# Patient Record
Sex: Male | Born: 1965 | Race: Black or African American | Hispanic: No | Marital: Single | Smoking: Never smoker
Health system: Southern US, Community
[De-identification: ages and names within clinical notes are randomized; demographics above are authoritative.]

## PROBLEM LIST (undated history)

## (undated) DIAGNOSIS — R011 Cardiac murmur, unspecified: Secondary | ICD-10-CM

## (undated) DIAGNOSIS — G4730 Sleep apnea, unspecified: Secondary | ICD-10-CM

## (undated) DIAGNOSIS — IMO0002 Reserved for concepts with insufficient information to code with codable children: Secondary | ICD-10-CM

## (undated) DIAGNOSIS — R0602 Shortness of breath: Secondary | ICD-10-CM

## (undated) DIAGNOSIS — R51 Headache: Secondary | ICD-10-CM

## (undated) DIAGNOSIS — M4804 Spinal stenosis, thoracic region: Secondary | ICD-10-CM

## (undated) DIAGNOSIS — E7800 Pure hypercholesterolemia, unspecified: Secondary | ICD-10-CM

## (undated) DIAGNOSIS — M4714 Other spondylosis with myelopathy, thoracic region: Secondary | ICD-10-CM

## (undated) HISTORY — DX: Pure hypercholesterolemia, unspecified: E78.00

## (undated) HISTORY — PX: ANKLE FRACTURE SURGERY: SHX122

## (undated) HISTORY — PX: BACK SURGERY: SHX140

## (undated) HISTORY — DX: Other spondylosis with myelopathy, thoracic region: M47.14

## (undated) HISTORY — DX: Spinal stenosis, thoracic region: M48.04

## (undated) HISTORY — DX: Reserved for concepts with insufficient information to code with codable children: IMO0002

---

## 2010-01-24 ENCOUNTER — Ambulatory Visit (HOSPITAL_COMMUNITY): Admission: RE | Admit: 2010-01-24 | Discharge: 2010-01-25 | Payer: Self-pay | Admitting: Neurosurgery

## 2010-02-21 ENCOUNTER — Inpatient Hospital Stay (HOSPITAL_COMMUNITY): Admission: RE | Admit: 2010-02-21 | Discharge: 2010-02-27 | Payer: Self-pay | Admitting: Neurosurgery

## 2010-02-21 HISTORY — PX: THORACIC DISCECTOMY: SHX100

## 2010-02-26 ENCOUNTER — Ambulatory Visit: Payer: Self-pay | Admitting: Physical Medicine & Rehabilitation

## 2010-02-27 ENCOUNTER — Ambulatory Visit: Payer: Self-pay | Admitting: Physical Medicine & Rehabilitation

## 2010-02-27 ENCOUNTER — Inpatient Hospital Stay (HOSPITAL_COMMUNITY)
Admission: RE | Admit: 2010-02-27 | Discharge: 2010-03-10 | Payer: Self-pay | Admitting: Physical Medicine & Rehabilitation

## 2010-04-14 ENCOUNTER — Encounter
Admission: RE | Admit: 2010-04-14 | Discharge: 2010-04-14 | Payer: Self-pay | Source: Home / Self Care | Attending: Physical Medicine & Rehabilitation | Admitting: Physical Medicine & Rehabilitation

## 2010-10-17 LAB — URINE CULTURE
Colony Count: >=100000
Culture  Setup Time: 201108012118

## 2010-10-17 LAB — URINALYSIS, ROUTINE W REFLEX MICROSCOPIC
Hgb urine dipstick: NEGATIVE
Protein, ur: NEGATIVE mg/dL
Urobilinogen, UA: 0.2 mg/dL (ref 0.0–1.0)

## 2010-10-17 LAB — BASIC METABOLIC PANEL
Chloride: 91 mEq/L — ABNORMAL LOW (ref 96–112)
Creatinine, Ser: 1.12 mg/dL (ref 0.4–1.5)
Glucose, Bld: 230 mg/dL — ABNORMAL HIGH (ref 70–99)

## 2010-10-18 LAB — DIFFERENTIAL
Basophils Relative: 0 % (ref 0–1)
Lymphocytes Relative: 10 % — ABNORMAL LOW (ref 12–46)
Monocytes Absolute: 0.7 10*3/uL (ref 0.1–1.0)

## 2010-10-18 LAB — BASIC METABOLIC PANEL
CO2: 29 mEq/L (ref 19–32)
GFR calc Af Amer: >60 mL/min (ref >60)
GFR calc non Af Amer: >60 mL/min (ref >60)
Potassium: 4.2 mEq/L (ref 3.5–5.1)

## 2010-10-18 LAB — CBC
HCT: 40.3 % (ref 39.0–52.0)
Hemoglobin: 13.3 g/dL (ref 13.0–17.0)
MCH: 31.1 pg (ref 26.0–34.0)
MCH: 31.4 pg (ref 26.0–34.0)
MCHC: 34.3 g/dL (ref 30.0–36.0)
MCV: 91.4 fL (ref 78.0–100.0)
MCV: 91.5 fL (ref 78.0–100.0)
Platelets: 213 10*3/uL (ref 150–400)
Platelets: 226 10*3/uL (ref 150–400)
RBC: 4.41 MIL/uL (ref 4.22–5.81)
WBC: 13.6 10*3/uL — ABNORMAL HIGH (ref 4.0–10.5)
WBC: 5.7 10*3/uL (ref 4.0–10.5)

## 2010-10-18 LAB — HEPATIC FUNCTION PANEL
Albumin: 4.3 g/dL (ref 3.5–5.2)
Alkaline Phosphatase: 60 U/L (ref 39–117)
Bilirubin, Direct: 0.2 mg/dL (ref 0.0–0.3)
Total Bilirubin: 0.5 mg/dL (ref 0.3–1.2)

## 2010-10-18 LAB — COMPREHENSIVE METABOLIC PANEL
ALT: 50 U/L (ref 0–53)
CO2: 30 mEq/L (ref 19–32)
Chloride: 92 mEq/L — ABNORMAL LOW (ref 96–112)
Creatinine, Ser: 1.06 mg/dL (ref 0.4–1.5)
GFR calc Af Amer: >60 mL/min (ref >60)
Potassium: 4.4 mEq/L (ref 3.5–5.1)
Sodium: 128 mEq/L — ABNORMAL LOW (ref 135–145)
Total Protein: 6.5 g/dL (ref 6.0–8.3)

## 2010-10-19 LAB — DIFFERENTIAL
Basophils Absolute: 0 10*3/uL (ref 0.0–0.1)
Eosinophils Absolute: 0.1 10*3/uL (ref 0.0–0.7)
Lymphocytes Relative: 22 % (ref 12–46)
Lymphs Abs: 1.5 10*3/uL (ref 0.7–4.0)
Monocytes Absolute: 0.8 10*3/uL (ref 0.1–1.0)
Neutrophils Relative %: 65 % (ref 43–77)

## 2010-10-19 LAB — CBC
HCT: 42.5 % (ref 39.0–52.0)
MCV: 92.2 fL (ref 78.0–100.0)
RBC: 4.61 MIL/uL (ref 4.22–5.81)
RDW: 13.4 % (ref 11.5–15.5)
WBC: 7 10*3/uL (ref 4.0–10.5)

## 2010-10-19 LAB — SURGICAL PCR SCREEN: MRSA, PCR: NEGATIVE

## 2011-11-18 ENCOUNTER — Ambulatory Visit (INDEPENDENT_AMBULATORY_CARE_PROVIDER_SITE_OTHER): Payer: Medicare Other | Admitting: Thoracic Surgery

## 2011-11-18 VITALS — BP 122/78 | HR 76 | Resp 20 | Ht 72.0 in | Wt 290.0 lb

## 2011-11-18 DIAGNOSIS — M5104 Intervertebral disc disorders with myelopathy, thoracic region: Secondary | ICD-10-CM

## 2011-11-18 DIAGNOSIS — M5124 Other intervertebral disc displacement, thoracic region: Secondary | ICD-10-CM

## 2011-11-18 NOTE — Progress Notes (Signed)
PCP is Elizebeth Koller, PA, PA Referring Provider is Carmela Hurt, MD  Chief Complaint  Patient presents with  . Herniated Nucleus Propulsus    Referral from Dr Franky Macho for eval on thoracic exsposure    HPI: This patient had a previous posterior approach for a T9 disc with a foraminotomy. According to the patient he was "paralyzed" after his surgery. However this in route and he did well for a while. Now he is having more weakness in his legs and numbness in both legs as well as his trouble with his bowels and sexual dysfunction. MRI in October and apparently an MRI done in [place] in the descending per showed her for a recurrent disc at T9-10. It is been recommended that he possibly have an anterior approach and repeat diskectomy by Dr. Mikal Plane. He is here for exploration of a possible anterior approach used to a right or left thoracotomy. I explained the risk of a thoracotomy. The patient understands the risk. I will discuss this with Dr. Mikal Plane. I will be happy to provide the exposure to Dr. Alvester Morin elect to do a repeat surgery. Past Medical History  Diagnosis Date  . Thoracic stenosis   . Myelopathy of thoracic region   . Herniated disc   . Diabetes mellitus   . High cholesterol     Past Surgical History  Procedure Date  . Thoracic discectomy 02/21/2010    Dr Franky Macho    No family history on file.  Social History History  Substance Use Topics  . Smoking status: Never Smoker   . Smokeless tobacco: Never Used  . Alcohol Use: Yes     5 drinks per week    Current Outpatient Prescriptions  Medication Sig Dispense Refill  . metFORMIN (GLUCOPHAGE) 500 MG tablet Take 500 mg by mouth 2 (two) times daily with a meal.      . oxyCODONE (OXYCONTIN) 15 MG TB12 Take 15 mg by mouth every 12 (twelve) hours.      Marland Kitchen oxyCODONE (OXYCONTIN) 40 MG 12 hr tablet Take 40 mg by mouth every 12 (twelve) hours.      . pravastatin (PRAVACHOL) 40 MG tablet Take 40 mg by mouth daily.        Allergies    Allergen Reactions  . Morphine And Related Nausea Only    Review of Systems  HENT: Negative.   Eyes: Negative.   Respiratory: Negative.   Genitourinary: Positive for difficulty urinating.  Musculoskeletal: Positive for back pain and gait problem.  Neurological: Positive for weakness and numbness.  Hematological: Negative.   Psychiatric/Behavioral: Negative.     BP 122/78  Pulse 76  Resp 20  Ht 6' (1.829 m)  Wt 290 lb (131.543 kg)  BMI 39.33 kg/m2  SpO2 97% Physical Exam  Constitutional: He is oriented to person, place, and time. He appears well-developed.  HENT:  Head: Normocephalic and atraumatic.  Right Ear: External ear normal.  Mouth/Throat: Oropharynx is clear and moist.  Eyes: Conjunctivae and EOM are normal. Pupils are equal, round, and reactive to light.  Neck: Normal range of motion. Neck supple.  Cardiovascular: Normal rate, regular rhythm, normal heart sounds and intact distal pulses.   Pulmonary/Chest: Effort normal and breath sounds normal.  Abdominal: Soft. Bowel sounds are normal.  Musculoskeletal: Normal range of motion.  Neurological: He is alert and oriented to person, place, and time. He has normal reflexes.  Skin: Skin is warm and dry.  Psychiatric: He has a normal mood and affect. His behavior  is normal. Judgment and thought content normal.   posterior midline approach  scar   Diagnostic Tests: MRI shows that T9 her disc herniation   Impression: T9 disc herniation status post discectomy Plan: Per Dr. Mikal Plane

## 2011-12-21 ENCOUNTER — Other Ambulatory Visit: Payer: Self-pay | Admitting: Neurosurgery

## 2012-01-05 ENCOUNTER — Other Ambulatory Visit: Payer: Self-pay

## 2012-01-05 ENCOUNTER — Inpatient Hospital Stay (HOSPITAL_COMMUNITY): Admission: RE | Admit: 2012-01-05 | Discharge: 2012-01-05 | Payer: Medicare Other | Source: Ambulatory Visit

## 2012-01-05 ENCOUNTER — Encounter (HOSPITAL_COMMUNITY): Payer: Self-pay | Admitting: Pharmacy Technician

## 2012-01-05 ENCOUNTER — Encounter (HOSPITAL_COMMUNITY): Payer: Self-pay

## 2012-01-05 DIAGNOSIS — M519 Unspecified thoracic, thoracolumbar and lumbosacral intervertebral disc disorder: Secondary | ICD-10-CM

## 2012-01-05 HISTORY — DX: Headache: R51

## 2012-01-05 HISTORY — DX: Sleep apnea, unspecified: G47.30

## 2012-01-05 HISTORY — DX: Cardiac murmur, unspecified: R01.1

## 2012-01-05 HISTORY — DX: Shortness of breath: R06.02

## 2012-01-05 NOTE — Pre-Procedure Instructions (Signed)
20 Allen Fleming  01/05/2012   Your procedure is scheduled on:  01/19/12  Report to [facility] at 530 AM.  Call this number if you have problems the morning of surgery: [phone]   Remember:   Do not eat food:After Midnight.  May have clear liquids: up to 4 Hours before arrival 130 AM.  Clear liquids include soda, tea, black coffee, apple or grape juice, broth.  Take these medicines the morning of surgery with A SIP OF WATER: PAIN MED   Do not wear jewelry, make-up or nail polish.  Do not wear lotions, powders, or perfumes. You may wear deodorant.  Do not shave 48 hours prior to surgery. Men may shave face and neck.  Do not bring valuables to the hospital.  Contacts, dentures or bridgework may not be worn into surgery.  Leave suitcase in the car. After surgery it may be brought to your room.  For patients admitted to the hospital, checkout time is 11:00 AM the day of discharge.   Patients discharged the day of surgery will not be allowed to drive home.  Name and phone number of your driver: Sherley Bounds AUNT [phone] CELL  Special Instructions: CHG Shower Use Special Wash: 1/2 bottle night before surgery and 1/2 bottle morning of surgery.   Please read over the following fact sheets that you were given: Pain Booklet, Coughing and Deep Breathing, Blood Transfusion Information, MRSA Information and Surgical Site Infection Prevention

## 2012-01-05 NOTE — Progress Notes (Signed)
DEE AT [facility] CALLED MESSAGE LEFT FOR ORDERS FOR DR Edwyna Shell OR WHOMEVER IS ASSISTING

## 2012-01-12 ENCOUNTER — Encounter (HOSPITAL_COMMUNITY)
Admission: RE | Admit: 2012-01-12 | Discharge: 2012-01-12 | Disposition: A | Discharge status: Home / Self Care | Payer: Medicare Other | Source: Ambulatory Visit | Attending: Neurosurgery | Admitting: Neurosurgery

## 2012-01-12 VITALS — BP 125/82 | HR 77 | Temp 97.9°F | Resp 20 | Ht 72.0 in | Wt 286.7 lb

## 2012-01-12 DIAGNOSIS — M519 Unspecified thoracic, thoracolumbar and lumbosacral intervertebral disc disorder: Secondary | ICD-10-CM

## 2012-01-12 LAB — BASIC METABOLIC PANEL
CO2: 30 mEq/L (ref 19–32)
Calcium: 9.7 mg/dL (ref 8.4–10.5)
Creatinine, Ser: 1.08 mg/dL (ref 0.50–1.35)
Glucose, Bld: 97 mg/dL (ref 70–99)

## 2012-01-12 LAB — CBC
MCH: 31.2 pg (ref 26.0–34.0)
MCHC: 35 g/dL (ref 30.0–36.0)
MCV: 89.1 fL (ref 78.0–100.0)
Platelets: 215 10*3/uL (ref 150–400)
RDW: 12.6 % (ref 11.5–15.5)

## 2012-01-12 LAB — ABO/RH: ABO/RH(D): O POS

## 2012-01-12 NOTE — Progress Notes (Signed)
Per Darl Pikes at Dr. Sueanne Margarita office, have consent signed as order states, "Right thoracotomy for T9/T10 herniated disc". Order was written that way by Dr. Franky Macho.

## 2012-01-14 MED ORDER — KETOROLAC TROMETHAMINE 30 MG/ML IJ SOLN
INTRAMUSCULAR | Status: AC
  Filled 2012-01-14: qty 1

## 2012-01-18 MED ORDER — CEFAZOLIN SODIUM-DEXTROSE 2-3 GM-% IV SOLR
2.0000 g | INTRAVENOUS | Status: AC
  Administered 2012-01-19 (×2): 2 g via INTRAVENOUS
  Filled 2012-01-18: qty 50

## 2012-01-19 ENCOUNTER — Ambulatory Visit (HOSPITAL_COMMUNITY): Payer: Medicare Other | Admitting: Certified Registered"

## 2012-01-19 ENCOUNTER — Encounter (HOSPITAL_COMMUNITY): Payer: Self-pay

## 2012-01-19 ENCOUNTER — Ambulatory Visit (HOSPITAL_COMMUNITY): Payer: Medicare Other

## 2012-01-19 ENCOUNTER — Encounter (HOSPITAL_COMMUNITY): Payer: Self-pay | Admitting: *Deleted

## 2012-01-19 ENCOUNTER — Encounter (HOSPITAL_COMMUNITY): Payer: Self-pay | Admitting: Certified Registered"

## 2012-01-19 ENCOUNTER — Inpatient Hospital Stay (HOSPITAL_COMMUNITY)
Admission: RE | Admit: 2012-01-19 | Discharge: 2012-01-24 | DRG: 490 | Disposition: A | Discharge status: Home / Self Care | Payer: Medicare Other | Source: Ambulatory Visit | Attending: Neurosurgery | Admitting: Neurosurgery

## 2012-01-19 ENCOUNTER — Encounter (HOSPITAL_COMMUNITY)
Admission: RE | Disposition: A | Discharge status: Home / Self Care | Payer: Self-pay | Source: Ambulatory Visit | Attending: Neurosurgery

## 2012-01-19 DIAGNOSIS — Z6841 Body Mass Index (BMI) 40.0 and over, adult: Secondary | ICD-10-CM

## 2012-01-19 DIAGNOSIS — Z79899 Other long term (current) drug therapy: Secondary | ICD-10-CM

## 2012-01-19 DIAGNOSIS — G4730 Sleep apnea, unspecified: Secondary | ICD-10-CM | POA: Diagnosis present

## 2012-01-19 DIAGNOSIS — E7800 Pure hypercholesterolemia, unspecified: Secondary | ICD-10-CM | POA: Diagnosis present

## 2012-01-19 DIAGNOSIS — M5104 Intervertebral disc disorders with myelopathy, thoracic region: Principal | ICD-10-CM | POA: Diagnosis present

## 2012-01-19 DIAGNOSIS — E119 Type 2 diabetes mellitus without complications: Secondary | ICD-10-CM | POA: Diagnosis present

## 2012-01-19 DIAGNOSIS — M546 Pain in thoracic spine: Secondary | ICD-10-CM

## 2012-01-19 DIAGNOSIS — Z01812 Encounter for preprocedural laboratory examination: Secondary | ICD-10-CM

## 2012-01-19 SURGERY — TRANSTHORACIC DISCECTOMY
Anesthesia: General | Site: Spine Thoracic | Laterality: Right | Wound class: Clean

## 2012-01-19 MED ORDER — ZOLPIDEM TARTRATE 5 MG PO TABS: 10.0000 mg | ORAL_TABLET | Freq: Every evening | ORAL | Status: DC | PRN

## 2012-01-19 MED ORDER — SENNOSIDES-DOCUSATE SODIUM 8.6-50 MG PO TABS
1.0000 | ORAL_TABLET | Freq: Every evening | ORAL | Status: DC | PRN
  Filled 2012-01-19 (×2): qty 1

## 2012-01-19 MED ORDER — ACETAMINOPHEN 650 MG RE SUPP: 650.0000 mg | RECTAL | Status: DC | PRN

## 2012-01-19 MED ORDER — DEXAMETHASONE SODIUM PHOSPHATE 4 MG/ML IJ SOLN
4.0000 mg | Freq: Four times a day (QID) | INTRAMUSCULAR | Status: AC
  Administered 2012-01-19 – 2012-01-20 (×3): 4 mg via INTRAVENOUS
  Filled 2012-01-19 (×5): qty 1

## 2012-01-19 MED ORDER — CLONIDINE HCL 0.1 MG PO TABS
0.1000 mg | ORAL_TABLET | Freq: Two times a day (BID) | ORAL | Status: DC | PRN
  Filled 2012-01-19: qty 1

## 2012-01-19 MED ORDER — MENTHOL 3 MG MT LOZG: 1.0000 | LOZENGE | OROMUCOSAL | Status: DC | PRN

## 2012-01-19 MED ORDER — 0.9 % SODIUM CHLORIDE (POUR BTL) OPTIME
TOPICAL | Status: DC | PRN
  Administered 2012-01-19: 1000 mL

## 2012-01-19 MED ORDER — VECURONIUM BROMIDE 10 MG IV SOLR
INTRAVENOUS | Status: DC | PRN
  Administered 2012-01-19: 1 mg via INTRAVENOUS
  Administered 2012-01-19 (×2): 2 mg via INTRAVENOUS
  Administered 2012-01-19: 3 mg via INTRAVENOUS

## 2012-01-19 MED ORDER — HYDROMORPHONE 0.3 MG/ML IV SOLN
INTRAVENOUS | Status: DC
  Administered 2012-01-19: 16:00:00 via INTRAVENOUS
  Administered 2012-01-19: 6 mg via INTRAVENOUS
  Administered 2012-01-20: 5.4 mg via INTRAVENOUS
  Administered 2012-01-20: 1.8 mg via INTRAVENOUS
  Administered 2012-01-20: 0.9 mg via INTRAVENOUS
  Administered 2012-01-20: 4.8 mg via INTRAVENOUS
  Administered 2012-01-20: 02:00:00 via INTRAVENOUS
  Administered 2012-01-21: 0.9 mg via INTRAVENOUS
  Administered 2012-01-21: 1.2 mg via INTRAVENOUS
  Administered 2012-01-21: via INTRAVENOUS
  Administered 2012-01-21: 1.2 mg via INTRAVENOUS
  Filled 2012-01-19 (×2): qty 25

## 2012-01-19 MED ORDER — OXYCODONE HCL 15 MG PO TB12: 15.0000 mg | ORAL_TABLET | Freq: Four times a day (QID) | ORAL | Status: DC

## 2012-01-19 MED ORDER — ACETAMINOPHEN 325 MG PO TABS
650.0000 mg | ORAL_TABLET | ORAL | Status: DC | PRN
  Administered 2012-01-23: 650 mg via ORAL
  Filled 2012-01-19 (×2): qty 2

## 2012-01-19 MED ORDER — ACETAMINOPHEN 10 MG/ML IV SOLN
INTRAVENOUS | Status: AC
  Filled 2012-01-19: qty 100

## 2012-01-19 MED ORDER — BUPIVACAINE-EPINEPHRINE PF 0.25-1:200000 % IJ SOLN
INTRAMUSCULAR | Status: AC
  Filled 2012-01-19: qty 30

## 2012-01-19 MED ORDER — HYDROCODONE-ACETAMINOPHEN 5-325 MG PO TABS
1.0000 | ORAL_TABLET | ORAL | Status: DC | PRN
  Administered 2012-01-23: 2 via ORAL
  Filled 2012-01-19: qty 2

## 2012-01-19 MED ORDER — LIDOCAINE-EPINEPHRINE (PF) 1 %-1:200000 IJ SOLN: INTRAMUSCULAR | Status: DC | PRN

## 2012-01-19 MED ORDER — POTASSIUM CHLORIDE IN NACL 20-0.9 MEQ/L-% IV SOLN
INTRAVENOUS | Status: DC
  Administered 2012-01-19: 18:00:00 via INTRAVENOUS
  Administered 2012-01-20: 100 mL/h via INTRAVENOUS
  Administered 2012-01-20: 23:00:00 via INTRAVENOUS
  Filled 2012-01-19 (×8): qty 1000

## 2012-01-19 MED ORDER — MIDAZOLAM HCL 5 MG/5ML IJ SOLN
INTRAMUSCULAR | Status: DC | PRN
  Administered 2012-01-19: 2 mg via INTRAVENOUS

## 2012-01-19 MED ORDER — SODIUM CHLORIDE 0.9 % IJ SOLN
3.0000 mL | Freq: Two times a day (BID) | INTRAMUSCULAR | Status: DC
  Administered 2012-01-19 – 2012-01-21 (×4): 3 mL via INTRAVENOUS

## 2012-01-19 MED ORDER — DEXTROSE 5 % IV SOLN
1.5000 g | Freq: Two times a day (BID) | INTRAVENOUS | Status: AC
  Administered 2012-01-19 – 2012-01-20 (×2): 1.5 g via INTRAVENOUS
  Filled 2012-01-19 (×2): qty 1.5

## 2012-01-19 MED ORDER — KETOROLAC TROMETHAMINE 30 MG/ML IJ SOLN
30.0000 mg | Freq: Four times a day (QID) | INTRAMUSCULAR | Status: DC
  Administered 2012-01-20 – 2012-01-22 (×8): 30 mg via INTRAVENOUS
  Filled 2012-01-19 (×17): qty 1

## 2012-01-19 MED ORDER — TRAMADOL HCL 50 MG PO TABS
50.0000 mg | ORAL_TABLET | Freq: Four times a day (QID) | ORAL | Status: DC | PRN
  Filled 2012-01-19: qty 2

## 2012-01-19 MED ORDER — FENTANYL CITRATE 0.05 MG/ML IJ SOLN
INTRAMUSCULAR | Status: DC | PRN
  Administered 2012-01-19 (×2): 100 ug via INTRAVENOUS
  Administered 2012-01-19 (×2): 50 ug via INTRAVENOUS
  Administered 2012-01-19: 25 ug via INTRAVENOUS
  Administered 2012-01-19: 100 ug via INTRAVENOUS
  Administered 2012-01-19: 25 ug via INTRAVENOUS
  Administered 2012-01-19 (×3): 50 ug via INTRAVENOUS

## 2012-01-19 MED ORDER — DIAZEPAM 5 MG PO TABS: 5.0000 mg | ORAL_TABLET | Freq: Four times a day (QID) | ORAL | Status: DC | PRN

## 2012-01-19 MED ORDER — HETASTARCH-ELECTROLYTES 6 % IV SOLN
INTRAVENOUS | Status: DC | PRN
  Administered 2012-01-19: 11:00:00 via INTRAVENOUS

## 2012-01-19 MED ORDER — ONDANSETRON HCL 4 MG/2ML IJ SOLN
INTRAMUSCULAR | Status: DC | PRN
  Administered 2012-01-19: 4 mg via INTRAVENOUS

## 2012-01-19 MED ORDER — DIPHENHYDRAMINE HCL 50 MG/ML IJ SOLN: 12.5000 mg | Freq: Four times a day (QID) | INTRAMUSCULAR | Status: DC | PRN

## 2012-01-19 MED ORDER — ACETAMINOPHEN 10 MG/ML IV SOLN
INTRAVENOUS | Status: DC | PRN
  Administered 2012-01-19: 1000 mg via INTRAVENOUS

## 2012-01-19 MED ORDER — POTASSIUM CHLORIDE 10 MEQ/50ML IV SOLN
10.0000 meq | Freq: Every day | INTRAVENOUS | Status: DC | PRN
  Filled 2012-01-19: qty 50

## 2012-01-19 MED ORDER — BISACODYL 5 MG PO TBEC
10.0000 mg | DELAYED_RELEASE_TABLET | Freq: Every day | ORAL | Status: DC
  Administered 2012-01-19 – 2012-01-24 (×6): 10 mg via ORAL
  Filled 2012-01-19: qty 2
  Filled 2012-01-19: qty 1
  Filled 2012-01-19 (×4): qty 2
  Filled 2012-01-19: qty 1
  Filled 2012-01-19: qty 2

## 2012-01-19 MED ORDER — LABETALOL HCL 5 MG/ML IV SOLN
5.0000 mg | INTRAVENOUS | Status: DC | PRN
  Administered 2012-01-19 (×2): 5 mg via INTRAVENOUS

## 2012-01-19 MED ORDER — ZOLPIDEM TARTRATE 10 MG PO TABS: 10.0000 mg | ORAL_TABLET | Freq: Every evening | ORAL | Status: DC | PRN

## 2012-01-19 MED ORDER — NALOXONE HCL 0.4 MG/ML IJ SOLN: 0.4000 mg | INTRAMUSCULAR | Status: DC | PRN

## 2012-01-19 MED ORDER — ONDANSETRON HCL 4 MG/2ML IJ SOLN: 4.0000 mg | Freq: Four times a day (QID) | INTRAMUSCULAR | Status: DC | PRN

## 2012-01-19 MED ORDER — OXYCODONE HCL 5 MG PO TABS
15.0000 mg | ORAL_TABLET | Freq: Four times a day (QID) | ORAL | Status: DC
  Administered 2012-01-20 – 2012-01-23 (×10): 15 mg via ORAL
  Filled 2012-01-19 (×7): qty 3
  Filled 2012-01-19: qty 2
  Filled 2012-01-19 (×5): qty 3
  Filled 2012-01-19: qty 1
  Filled 2012-01-19: qty 3

## 2012-01-19 MED ORDER — SODIUM CHLORIDE 0.9 % IV SOLN: 250.0000 mL | INTRAVENOUS | Status: DC

## 2012-01-19 MED ORDER — SIMVASTATIN 20 MG PO TABS
20.0000 mg | ORAL_TABLET | Freq: Every day | ORAL | Status: DC
  Administered 2012-01-20 – 2012-01-23 (×4): 20 mg via ORAL
  Filled 2012-01-19 (×6): qty 1

## 2012-01-19 MED ORDER — BUPIVACAINE ON-Q PAIN PUMP (FOR ORDER SET NO CHG)
INJECTION | Status: DC
  Filled 2012-01-19: qty 1

## 2012-01-19 MED ORDER — ROCURONIUM BROMIDE 100 MG/10ML IV SOLN
INTRAVENOUS | Status: DC | PRN
  Administered 2012-01-19: 20 mg via INTRAVENOUS
  Administered 2012-01-19: 50 mg via INTRAVENOUS
  Administered 2012-01-19: 30 mg via INTRAVENOUS

## 2012-01-19 MED ORDER — ONDANSETRON HCL 4 MG/2ML IJ SOLN: 4.0000 mg | INTRAMUSCULAR | Status: DC | PRN

## 2012-01-19 MED ORDER — DIPHENHYDRAMINE HCL 12.5 MG/5ML PO ELIX
12.5000 mg | ORAL_SOLUTION | Freq: Four times a day (QID) | ORAL | Status: DC | PRN
  Filled 2012-01-19: qty 5

## 2012-01-19 MED ORDER — THROMBIN 20000 UNITS EX KIT
PACK | CUTANEOUS | Status: DC | PRN
  Administered 2012-01-19: 20000 [IU] via TOPICAL

## 2012-01-19 MED ORDER — FENTANYL CITRATE 0.05 MG/ML IJ SOLN
INTRAMUSCULAR | Status: AC
  Filled 2012-01-19: qty 2

## 2012-01-19 MED ORDER — METFORMIN HCL 500 MG PO TABS
500.0000 mg | ORAL_TABLET | Freq: Two times a day (BID) | ORAL | Status: DC
  Administered 2012-01-20 – 2012-01-24 (×8): 500 mg via ORAL
  Filled 2012-01-19 (×13): qty 1

## 2012-01-19 MED ORDER — LACTATED RINGERS IV SOLN
INTRAVENOUS | Status: DC | PRN
  Administered 2012-01-19 (×3): via INTRAVENOUS

## 2012-01-19 MED ORDER — DEXAMETHASONE SODIUM PHOSPHATE 4 MG/ML IJ SOLN
INTRAMUSCULAR | Status: DC | PRN
  Administered 2012-01-19: 10 mg via INTRAVENOUS

## 2012-01-19 MED ORDER — LABETALOL HCL 5 MG/ML IV SOLN
INTRAVENOUS | Status: AC
  Filled 2012-01-19: qty 4

## 2012-01-19 MED ORDER — BUPIVACAINE-EPINEPHRINE 0.25% -1:200000 IJ SOLN
INTRAMUSCULAR | Status: DC | PRN
  Administered 2012-01-19: 30 mL

## 2012-01-19 MED ORDER — ALBUTEROL SULFATE HFA 108 (90 BASE) MCG/ACT IN AERS
2.0000 | INHALATION_SPRAY | Freq: Four times a day (QID) | RESPIRATORY_TRACT | Status: DC
  Filled 2012-01-19: qty 6.7

## 2012-01-19 MED ORDER — FENTANYL CITRATE 0.05 MG/ML IJ SOLN
25.0000 ug | INTRAMUSCULAR | Status: DC | PRN
  Administered 2012-01-19: 50 ug via INTRAVENOUS

## 2012-01-19 MED ORDER — SODIUM CHLORIDE 0.9 % IV SOLN
INTRAVENOUS | Status: AC
  Filled 2012-01-19: qty 500

## 2012-01-19 MED ORDER — HEMOSTATIC AGENTS (NO CHARGE) OPTIME
TOPICAL | Status: DC | PRN
  Administered 2012-01-19: 1 via TOPICAL

## 2012-01-19 MED ORDER — CEFAZOLIN SODIUM-DEXTROSE 2-3 GM-% IV SOLR
INTRAVENOUS | Status: AC
  Filled 2012-01-19: qty 50

## 2012-01-19 MED ORDER — ACETAMINOPHEN 10 MG/ML IV SOLN
1000.0000 mg | Freq: Four times a day (QID) | INTRAVENOUS | Status: AC
  Administered 2012-01-19 – 2012-01-20 (×4): 1000 mg via INTRAVENOUS
  Filled 2012-01-19 (×4): qty 100

## 2012-01-19 MED ORDER — SODIUM CHLORIDE 0.9 % IJ SOLN: 9.0000 mL | INTRAMUSCULAR | Status: DC | PRN

## 2012-01-19 MED ORDER — KETOROLAC TROMETHAMINE 30 MG/ML IJ SOLN
INTRAMUSCULAR | Status: AC
  Administered 2012-01-19: 30 mg
  Filled 2012-01-19: qty 1

## 2012-01-19 MED ORDER — PHENOL 1.4 % MT LIQD: 1.0000 | OROMUCOSAL | Status: DC | PRN

## 2012-01-19 MED ORDER — PROPOFOL 10 MG/ML IV EMUL
INTRAVENOUS | Status: DC | PRN
  Administered 2012-01-19: 10 mg via INTRAVENOUS
  Administered 2012-01-19: 100 mg via INTRAVENOUS
  Administered 2012-01-19: 50 mg via INTRAVENOUS
  Administered 2012-01-19: 400 mg via INTRAVENOUS

## 2012-01-19 MED ORDER — DEXAMETHASONE 4 MG PO TABS
4.0000 mg | ORAL_TABLET | Freq: Four times a day (QID) | ORAL | Status: AC
  Administered 2012-01-20: 4 mg via ORAL
  Filled 2012-01-19 (×3): qty 1

## 2012-01-19 MED ORDER — BUPIVACAINE 0.5 % ON-Q PUMP SINGLE CATH 400 ML
400.0000 mL | INJECTION | Status: DC
  Filled 2012-01-19: qty 400

## 2012-01-19 MED ORDER — HYDROMORPHONE 0.3 MG/ML IV SOLN
INTRAVENOUS | Status: AC
  Filled 2012-01-19: qty 25

## 2012-01-19 MED ORDER — OXYCODONE-ACETAMINOPHEN 5-325 MG PO TABS: 1.0000 | ORAL_TABLET | ORAL | Status: DC | PRN

## 2012-01-19 MED ORDER — BACITRACIN 50000 UNITS IM SOLR
INTRAMUSCULAR | Status: AC
  Filled 2012-01-19: qty 1

## 2012-01-19 MED ORDER — SODIUM CHLORIDE 0.9 % IJ SOLN: 3.0000 mL | INTRAMUSCULAR | Status: DC | PRN

## 2012-01-19 MED ORDER — OXYCODONE HCL 40 MG PO TB12
40.0000 mg | ORAL_TABLET | Freq: Two times a day (BID) | ORAL | Status: DC
  Administered 2012-01-20 – 2012-01-24 (×6): 40 mg via ORAL
  Filled 2012-01-19: qty 4
  Filled 2012-01-19 (×5): qty 1
  Filled 2012-01-19: qty 4

## 2012-01-19 SURGICAL SUPPLY — 96 items
BAG DECANTER FOR FLEXI CONT (MISCELLANEOUS) IMPLANT
BENZOIN TINCTURE PRP APPL 2/3 (GAUZE/BANDAGES/DRESSINGS) IMPLANT
BLADE SURG ROTATE 9660 (MISCELLANEOUS) ×4 IMPLANT
BRUSH SCRUB EZ PLAIN DRY (MISCELLANEOUS) IMPLANT
BUR CUTTER 7.0 ROUND (BURR) IMPLANT
BUR MATCHSTICK NEURO 3.0 LAGG (BURR) ×4 IMPLANT
BUR PRECISION FLUTE 5.0 (BURR) ×2 IMPLANT
BUR ROUND FLUTED 4 SOFT TCH (BURR) ×2 IMPLANT
CANISTER SUCTION 2500CC (MISCELLANEOUS) ×2 IMPLANT
CATH HYDRAGLIDE XL THORACIC (CATHETERS) IMPLANT
CATH THORACIC 28FR (CATHETERS) ×4 IMPLANT
CATH THORACIC 36FR (CATHETERS) IMPLANT
CATH THORACIC 36FR RT ANG (CATHETERS) IMPLANT
CLIP TI LARGE 6 (CLIP) ×4 IMPLANT
CLIP TI MEDIUM 6 (CLIP) IMPLANT
CLOTH BEACON ORANGE TIMEOUT ST (SAFETY) ×2 IMPLANT
CONNECTOR PH 6IN1 Y STRL (MISCELLANEOUS) IMPLANT
CONT SPEC 4OZ CLIKSEAL STRL BL (MISCELLANEOUS) ×2 IMPLANT
CORDS BIPOLAR (ELECTRODE) ×2 IMPLANT
COVER TABLE BACK 60X90 (DRAPES) ×4 IMPLANT
DECANTER SPIKE VIAL GLASS SM (MISCELLANEOUS) IMPLANT
DERMABOND ADHESIVE PROPEN (GAUZE/BANDAGES/DRESSINGS) ×1
DERMABOND ADVANCED (GAUZE/BANDAGES/DRESSINGS) ×1
DERMABOND ADVANCED .7 DNX12 (GAUZE/BANDAGES/DRESSINGS) ×1 IMPLANT
DERMABOND ADVANCED .7 DNX6 (GAUZE/BANDAGES/DRESSINGS) ×1 IMPLANT
DRAPE C-ARM 42X72 X-RAY (DRAPES) ×4 IMPLANT
DRAPE LAPAROTOMY 100X72X124 (DRAPES) IMPLANT
DRAPE MICROSCOPE LEICA (MISCELLANEOUS) ×2 IMPLANT
DRAPE MICROSCOPE ZEISS OPMI (DRAPES) IMPLANT
DRAPE POUCH INSTRU U-SHP 10X18 (DRAPES) ×2 IMPLANT
DRAPE PROXIMA HALF (DRAPES) ×10 IMPLANT
DRAPE SURG 17X23 STRL (DRAPES) IMPLANT
ELECT BLADE 6.5 EXT (BLADE) ×2 IMPLANT
ELECT REM PT RETURN 9FT ADLT (ELECTROSURGICAL) ×2
ELECTRODE REM PT RTRN 9FT ADLT (ELECTROSURGICAL) ×1 IMPLANT
GAUZE SPONGE 4X4 16PLY XRAY LF (GAUZE/BANDAGES/DRESSINGS) IMPLANT
GLOVE BIO SURGEON STRL SZ 6.5 (GLOVE) ×6 IMPLANT
GLOVE BIOGEL PI IND STRL 7.0 (GLOVE) ×3 IMPLANT
GLOVE BIOGEL PI IND STRL 7.5 (GLOVE) ×1 IMPLANT
GLOVE BIOGEL PI INDICATOR 7.0 (GLOVE) ×3
GLOVE BIOGEL PI INDICATOR 7.5 (GLOVE) ×1
GLOVE ECLIPSE 6.5 STRL STRAW (GLOVE) ×4 IMPLANT
GLOVE ECLIPSE 7.5 STRL STRAW (GLOVE) ×2 IMPLANT
GLOVE ECLIPSE 8.5 STRL (GLOVE) ×4 IMPLANT
GLOVE EXAM NITRILE LRG STRL (GLOVE) IMPLANT
GLOVE EXAM NITRILE MD LF STRL (GLOVE) ×4 IMPLANT
GLOVE EXAM NITRILE XL STR (GLOVE) IMPLANT
GLOVE EXAM NITRILE XS STR PU (GLOVE) IMPLANT
GLOVE SURG EUDERMIC 8 LTX PF (GLOVE) ×4 IMPLANT
GOWN BRE IMP SLV AUR LG STRL (GOWN DISPOSABLE) ×10 IMPLANT
GOWN BRE IMP SLV AUR XL STRL (GOWN DISPOSABLE) ×10 IMPLANT
GOWN STRL NON-REIN LRG LVL3 (GOWN DISPOSABLE) IMPLANT
GOWN STRL REIN 2XL LVL4 (GOWN DISPOSABLE) ×4 IMPLANT
HEMOSTAT SURGICEL 2X14 (HEMOSTASIS) IMPLANT
KIT BASIN OR (CUSTOM PROCEDURE TRAY) ×2 IMPLANT
KIT ROOM TURNOVER OR (KITS) ×2 IMPLANT
LOOP VESSEL MAXI BLUE (MISCELLANEOUS) ×2 IMPLANT
MARKER SKIN DUAL TIP RULER LAB (MISCELLANEOUS) IMPLANT
NEEDLE HYPO 22GX1.5 SAFETY (NEEDLE) ×2 IMPLANT
NEEDLE SPNL 22GX3.5 QUINCKE BK (NEEDLE) ×4 IMPLANT
NS IRRIG 1000ML POUR BTL (IV SOLUTION) ×2 IMPLANT
ONQ SILVER SOAKER ANTIMICROBIAL CATHETER KIT ×2 IMPLANT
PACK CHEST (CUSTOM PROCEDURE TRAY) ×2 IMPLANT
PAD ARMBOARD 7.5X6 YLW CONV (MISCELLANEOUS) ×12 IMPLANT
PATTIES SURGICAL .5 X.5 (GAUZE/BANDAGES/DRESSINGS) ×2 IMPLANT
PATTIES SURGICAL 1X1 (DISPOSABLE) ×2 IMPLANT
PENCIL BUTTON HOLSTER BLD 10FT (ELECTRODE) ×2 IMPLANT
RUBBERBAND STERILE (MISCELLANEOUS) ×4 IMPLANT
SPONGE GAUZE 4X4 12PLY (GAUZE/BANDAGES/DRESSINGS) ×2 IMPLANT
SPONGE SURGIFOAM ABS GEL 100 (HEMOSTASIS) ×2 IMPLANT
SPONGE SURGIFOAM ABS GEL SZ50 (HEMOSTASIS) IMPLANT
STAPLER VISISTAT 35W (STAPLE) ×2 IMPLANT
STRIP CLOSURE SKIN 1/2X4 (GAUZE/BANDAGES/DRESSINGS) ×2 IMPLANT
SUT BONE WAX W31G (SUTURE) IMPLANT
SUT SILK 0 FSL (SUTURE) ×6 IMPLANT
SUT SILK 2 0SH CR/8 30 (SUTURE) IMPLANT
SUT VIC AB 0 CT1 27 (SUTURE) ×1
SUT VIC AB 0 CT1 27XBRD ANTBC (SUTURE) ×1 IMPLANT
SUT VIC AB 1 CTX 18 (SUTURE) ×4 IMPLANT
SUT VIC AB 1 CTX 36 (SUTURE) ×1
SUT VIC AB 1 CTX36XBRD ANBCTR (SUTURE) ×1 IMPLANT
SUT VIC AB 2-0 CT1 18 (SUTURE) IMPLANT
SUT VIC AB 2-0 CTX 36 (SUTURE) ×4 IMPLANT
SUT VIC AB 3-0 MH 27 (SUTURE) ×4 IMPLANT
SUT VIC AB 3-0 SH 8-18 (SUTURE) IMPLANT
SUT VIC AB 3-0 X1 27 (SUTURE) ×2 IMPLANT
SUT VICRYL 2 TP 1 (SUTURE) ×2 IMPLANT
SYR 20ML ECCENTRIC (SYRINGE) ×2 IMPLANT
TAPE CLOTH 4X10 WHT NS (GAUZE/BANDAGES/DRESSINGS) ×2 IMPLANT
TAPE CLOTH SURG 4X10 WHT LF (GAUZE/BANDAGES/DRESSINGS) ×2 IMPLANT
TOWEL OR 17X24 6PK STRL BLUE (TOWEL DISPOSABLE) ×4 IMPLANT
TOWEL OR 17X26 10 PK STRL BLUE (TOWEL DISPOSABLE) ×4 IMPLANT
TRAY FOLEY CATH 14FRSI W/METER (CATHETERS) ×2 IMPLANT
TUBE CONNECTING 12X1/4 (SUCTIONS) ×2 IMPLANT
WATER STERILE IRR 1000ML POUR (IV SOLUTION) ×2 IMPLANT
YANKAUER SUCT BULB TIP NO VENT (SUCTIONS) ×2 IMPLANT

## 2012-01-19 NOTE — Transfer of Care (Signed)
Immediate Anesthesia Transfer of Care Note  Patient: Allen Fleming  Procedure(s) Performed: Procedure(s) (LRB): TRANSTHORACIC DISCECTOMY (Right) THORACIC EXPOSURE (Right)  Patient Location: PACU  Anesthesia Type: General  Level of Consciousness: awake, oriented, unresponsive and responds to stimulation  Airway & Oxygen Therapy: Patient Spontanous Breathing and Patient connected to nasal cannula oxygen  Post-op Assessment: Report given to PACU RN and Post -op Vital signs reviewed and stable  Post vital signs: Reviewed and stable  Complications: No apparent anesthesia complications

## 2012-01-19 NOTE — Op Note (Signed)
01/19/2012  3:32 PM  PATIENT:  Malva Limes  46 y.o. male  PRE-OPERATIVE DIAGNOSIS:  Thoracic herniated disc T9/10  POST-OPERATIVE DIAGNOSIS:  Thoracic herniated disc T9/10  PROCEDURE:  Procedure(s): TRANSTHORACIC DISCECTOMY T9/10 THORACIC EXPOSURE  SURGEON:  Surgeon(s): Carmela Hurt, MD Ines Bloomer, MD Temple Pacini, MD  ASSISTANTS:pool  ANESTHESIA:   general  EBL:  Total I/O In: 3500 [I.V.:3000; IV Piggyback:500] Out: 1200 [Urine:700; Blood:500]  BLOOD ADMINISTERED:none  CELL SAVER GIVEN:none  COUNT:per nursing  DRAINS: chest tube   SPECIMEN:  No Specimen  DICTATION: Mr. Bolin was taken to the operating room intubated and placed under a general anesthetic without difficulty. A foley catheter was placed without difficulty. At this point Dr. Karle Plumber note dictated under separate cover deals with his thoracotomy.  After gaining exposure, we identified the correct level in both the AP plane and sagittal planes with fluoroscopy and plain films.  I used the monpolar cautery to remove some of the soft tissue overlying the disc and rib head. I opened the disc space with a 15 blade,and remove a small amount of disc with pituitary rongeurs. I then used the drill and microscope and drill into the disc space, T9, and T10 corpii to gain exposure to the spinal canal. I encountered very thick and calcified material just around the spinal cord. With Dr. Lindalou Hose assistance we were able to get around the cord in the canal. We removed disc and bone but were able to decompress the anterior and right anterior portion of the canal. I used a drill and various dissectors to achieve this. With the decompression being complete and further dissection certainly seeming more dangerous than beneficial we stopped.  Dr. Edwyna Shell returned and completed the case.  PLAN OF CARE: Admit to inpatient   PATIENT DISPOSITION:  PACU - hemodynamically stable.   Delay start of Pharmacological VTE agent  (>24hrs) due to surgical blood loss or risk of bleeding:  yes

## 2012-01-19 NOTE — H&P (Addendum)
BP 120/75  Pulse 72  Temp 98.4 F (36.9 C) (Oral)  Resp 18 Allen Fleming returns.  EMG and nerve conduction studies were normal.  He and I had about a half hour discussion with regards to this operation.  I told him that I think it is very risky to go back and remove what is a small amount of residual material; however, he says that without question he is getting worse and since he is getting worse he would like to proceed with the operation.  At the last operation, I did a posterior approach because he had bilateral compression.  Now the compression is truly just from the right side.  I plan on a thoracotomy.  He has already been seen and evaluated by Dr. Karle Plumber for this procedure.  We discussed the nature of a thoracotomy that it is a very painful incision, that I would probably fuse his spine afterwards by using a cage of some sort and a plate as a buttress to the cage so that it could not kick out.  He understands the significant risk to his lower extremity function, bowel, bladder function, sexual function, possible paralysis, further weakness, inability to remove the tumor, spinal cord damage, damage to the lungs, possible spinal cord infarct and other risks.  With that understanding, he would like to proceed and we will go ahead and schedule this with Dr. Edwyna Shell.   Past Medical History  Diagnosis Date  . Thoracic stenosis   . Myelopathy of thoracic region   . Herniated disc   . Diabetes mellitus   . High cholesterol   . Heart murmur   . Shortness of breath   . Sleep apnea     CPAP SINCE 98  . Headache    Past Surgical History  Procedure Date  . Thoracic discectomy 02/21/2010    Dr Franky Macho  . Back surgery   . Ankle fracture surgery     RT   Allergies  Allergen Reactions  . Morphine And Related Nausea Only   Prior to Admission medications   Medication Sig Start Date End Date Taking? Authorizing Provider  metFORMIN (GLUCOPHAGE) 500 MG tablet Take 500 mg by mouth 2 (two)  times daily with a meal.   Yes Historical Provider, MD  oxyCODONE (OXYCONTIN) 15 MG TB12 Take 15 mg by mouth every 6 (six) hours.    Yes Historical Provider, MD  oxyCODONE (OXYCONTIN) 40 MG 12 hr tablet Take 40 mg by mouth every 12 (twelve) hours.   Yes Historical Provider, MD  pravastatin (PRAVACHOL) 40 MG tablet Take 40 mg by mouth daily.   Yes Historical Provider, MD   History   Social History  . Marital Status: Single    Spouse Name: N/A    Number of Children: 4  . Years of Education: N/A   Occupational History  .     Social History Main Topics  . Smoking status: Never Smoker   . Smokeless tobacco: Never Used  . Alcohol Use: 2.4 oz/week    4 Cans of beer per week     5 drinks per week  . Drug Use: No  . Sexually Active: Yes    Birth Control/ Protection: Other-see comments   Other Topics Concern  . Not on file   Social History Narrative  . No narrative on file  alert, oriented x4 Speech clear and fluent 5/5 upper extremities, weakness bilaterally lower extremities Spastic gait,  Hyper reflexic lower extremities Decreased sensation bilateral lower extremities  Proceed to or for T9/10 discetomy.

## 2012-01-19 NOTE — Brief Op Note (Signed)
01/19/2012  2:58 PM  PATIENT:  Malva Limes  46 y.o. male  PRE-OPERATIVE DIAGNOSIS:  Thoracic herniated disc  POST-OPERATIVE DIAGNOSIS:  Thoracic herniated disc  PROCEDURE:  Procedure(s) (LRB): TRANSTHORACIC DISCECTOMY (Right) THORACIC EXPOSURE (Right)  SURGEON:  Surgeon(s) and Role: Panel 1:    * Carmela Hurt, MD - Primary    * Temple Pacini, MD - Assisting  Panel 2:    * Ines Bloomer, MD - Primary  PHYSICIAN ASSISTANT:   ASSISTANTS:Donnielle Joycelyn Man   ANESTHESIA:   general  EBL:  Total I/O In: 3500 [I.V.:3000; IV Piggyback:500] Out: 1100 [Urine:600; Blood:500]  BLOOD ADMINISTERED:none  DRAINS: right Chest Tube(s) in the pleura   LOCAL MEDICATIONS USED:  MARCAINE     SPECIMEN:  Biopsy / Limited Resection  DISPOSITION OF SPECIMEN:  PATHOLOGY  COUNTS:  YES  TOURNIQUET:  * No tourniquets in log *  DICTATION: .Other Dictation: Dictation Number [id number]  PLAN OF CARE: Admit to inpatient   PATIENT DISPOSITION:  PACU - hemodynamically stable.   Delay start of Pharmacological VTE agent (>24hrs) due to surgical blood loss or risk of bleeding: yes

## 2012-01-19 NOTE — H&P (Signed)
Allen Fleming   11/18/2011 2:15 PM Office Visit  MRN: [id number]   Description: 46 year old male  Provider: Cameron Proud, MD  Department: [facility]        Diagnoses     HNP (herniated nucleus pulposus), thoracic   - Primary    722.11    Intervertebral thoracic disc disorder with myelopathy, thoracic region     722.72      Reason for Visit     Herniated Nucleus Propulsus    Referral from Dr Franky Macho for eval on thoracic exsposure        Vitals - Last Recorded       BP Pulse Resp Ht Wt BMI    122/78 76 20 6' (1.829 m) 131.543 kg (290 lb) 39.33 kg/m2         SpO2              97%                 Vitals History Recorded       Progress Notes     Cameron Proud, MD  11/18/2011  3:10 PM  Signed PCP is Elizebeth Koller, PA, PA Referring Provider is Carmela Hurt, MD    Chief Complaint   Patient presents with   .  Herniated Nucleus Propulsus       Referral from Dr Franky Macho for eval on thoracic exsposure      HPI: This patient had a previous posterior approach for a T9 disc with a foraminotomy. According to the patient he was "paralyzed" after his surgery. However this in route and he did well for a while. Now he is having more weakness in his legs and numbness in both legs as well as his trouble with his bowels and sexual dysfunction. MRI in October and apparently an MRI done in [place] in the descending per showed her for a recurrent disc at T9-10. It is been recommended that he possibly have an anterior approach and repeat diskectomy by Dr. Mikal Plane. He is here for exploration of a possible anterior approach used to a right or left thoracotomy. I explained the risk of a thoracotomy. The patient understands the risk. I will discuss this with Dr. Mikal Plane. I will be happy to provide the exposure to Dr. Alvester Morin elect to do a repeat surgery. Past Medical History   Diagnosis  Date   .  Thoracic stenosis     .  Myelopathy of thoracic region     .   Herniated disc     .  Diabetes mellitus     .  High cholesterol         Past Surgical History   Procedure  Date   .  Thoracic discectomy  02/21/2010       Dr Franky Macho      No family history on file.   Social History History   Substance Use Topics   .  Smoking status:  Never Smoker    .  Smokeless tobacco:  Never Used   .  Alcohol Use:  Yes         5 drinks per week       Current Outpatient Prescriptions   Medication  Sig  Dispense  Refill   .  metFORMIN (GLUCOPHAGE) 500 MG tablet  Take 500 mg by mouth 2 (two) times daily with a meal.         .  oxyCODONE (OXYCONTIN) 15 MG TB12  Take 15 mg by mouth every 12 (twelve) hours.         Marland Kitchen  oxyCODONE (OXYCONTIN) 40 MG 12 hr tablet  Take 40 mg by mouth every 12 (twelve) hours.         .  pravastatin (PRAVACHOL) 40 MG tablet  Take 40 mg by mouth daily.             Allergies   Allergen  Reactions   .  Morphine And Related  Nausea Only      Review of Systems  HENT: Negative.   Eyes: Negative.   Respiratory: Negative.   Genitourinary: Positive for difficulty urinating.  Musculoskeletal: Positive for back pain and gait problem.  Neurological: Positive for weakness and numbness.  Hematological: Negative.   Psychiatric/Behavioral: Negative.     BP 122/78  Pulse 76  Resp 20  Ht 6' (1.829 m)  Wt 290 lb (131.543 kg)  BMI 39.33 kg/m2  SpO2 97% Physical Exam  Constitutional: He is oriented to person, place, and time. He appears well-developed.  HENT:   Head: Normocephalic and atraumatic.  Right Ear: External ear normal.   Mouth/Throat: Oropharynx is clear and moist.  Eyes: Conjunctivae and EOM are normal. Pupils are equal, round, and reactive to light.  Neck: Normal range of motion. Neck supple.  Cardiovascular: Normal rate, regular rhythm, normal heart sounds and intact distal pulses.   Pulmonary/Chest: Effort normal and breath sounds normal.  Abdominal: Soft. Bowel sounds are normal.  Musculoskeletal: Normal range of  motion.  Neurological: He is alert and oriented to person, place, and time. He has normal reflexes.  Skin: Skin is warm and dry.  Psychiatric: He has a normal mood and affect. His behavior is normal. Judgment and thought content normal.  posterior midline approach  scar     Diagnostic Tests: MRI shows that T9 her disc herniation     Impression: T9 disc herniation status post discectomy Plan: Per Dr. Mikal Plane        Electronic signature on 11/18/2011        Not recorded              Level of Service     PR OFFICE OUTPATIENT NEW 30 MINUTES [[id number]]         All Flowsheet Templates (all recorded)     Encounter Vitals Flowsheet    Custom Formula Data Flowsheet    Anthropometrics Flowsheet               Referring Provider          Carmela Hurt, MD       All Charges for This Encounter       Code Description Service Date Service Provider Modifiers Quantity    [phone] PR OFFICE OUTPATIENT NEW 30 MINUTES 11/18/2011 Ines Bloomer, MD   1    [phone] PR CURRENT TOBACCO NON-USER 11/18/2011 Ines Bloomer, MD   1        Other Encounter Related Information     Allergies & Medications         Problem List         History         Patient-Entered Questionnaires   Printed AVS Reports     No AVS reports have been printed for this encounter.        No data filed

## 2012-01-19 NOTE — Preoperative (Signed)
Beta Blockers   Reason not to administer Beta Blockers:Not Applicable 

## 2012-01-19 NOTE — Anesthesia Postprocedure Evaluation (Signed)
  Anesthesia Post-op Note  Patient: Allen Fleming  Procedure(s) Performed: Procedure(s) (LRB): TRANSTHORACIC DISCECTOMY (Right) THORACIC EXPOSURE (Right)  Patient Location: PACU  Anesthesia Type: General  Level of Consciousness: awake and alert   Airway and Oxygen Therapy: Patient Spontanous Breathing and Patient connected to face mask oxygen  Post-op Pain: mild  Post-op Assessment: Post-op Vital signs reviewed  Post-op Vital Signs: Reviewed  Complications: No apparent anesthesia complications

## 2012-01-19 NOTE — Progress Notes (Signed)
Pt's home CPAP machine set up and water added to humidifier. Pt stated he could place himself on later. RN aware.

## 2012-01-19 NOTE — Interval H&P Note (Signed)
History and Physical Interval Note:  01/19/2012 7:07 AM  Allen Fleming  has presented today for surgery, with the diagnosis of Thoracic herniated disc  The various methods of treatment have been discussed with the patient and family. After consideration of risks, benefits and other options for treatment, the patient has consented to  Procedure(s) (LRB): TRANSTHORACIC DISCECTOMY (Right) THORACIC EXPOSURE (N/A) as a surgical intervention .  The patient's history has been reviewed, patient examined, no change in status, stable for surgery.  I have reviewed the patients' chart and labs.  Questions were answered to the patient's satisfaction.     Cameron Proud

## 2012-01-19 NOTE — Progress Notes (Signed)
BP 125/59  Pulse 93  Temp 97 F (36.1 C) (Oral)  Resp 16  SpO2 97% Alert, following commands Moving lower extremities

## 2012-01-19 NOTE — Anesthesia Preprocedure Evaluation (Signed)
Anesthesia Evaluation  Patient identified by MRN, date of birth, ID band Patient awake    Reviewed: Allergy & Precautions, H&P , NPO status   Airway Mallampati: II      Dental   Pulmonary shortness of breath, sleep apnea ,          Cardiovascular negative cardio ROS      Neuro/Psych  Headaches,    GI/Hepatic negative GI ROS, Neg liver ROS,   Endo/Other  Diabetes mellitus-  Renal/GU negative Renal ROS     Musculoskeletal   Abdominal   Peds  Hematology negative hematology ROS (+)   Anesthesia Other Findings   Reproductive/Obstetrics                           Anesthesia Physical Anesthesia Plan  ASA: III  Anesthesia Plan: General   Post-op Pain Management:    Induction: Intravenous  Airway Management Planned: Double Lumen EBT  Additional Equipment: Arterial line  Intra-op Plan:   Post-operative Plan: Possible Post-op intubation/ventilation  Informed Consent:   Plan Discussed with: CRNA  Anesthesia Plan Comments:         Anesthesia Quick Evaluation

## 2012-01-19 NOTE — Anesthesia Procedure Notes (Addendum)
Procedure Name: Intubation Date/Time: 01/19/2012 8:56 AM Performed by: Rossie Muskrat L Pre-anesthesia Checklist: Patient identified, Timeout performed, Emergency Drugs available, Suction available and Patient being monitored Patient Re-evaluated:Patient Re-evaluated prior to inductionOxygen Delivery Method: Circle system utilized Preoxygenation: Pre-oxygenation with 100% oxygen Intubation Type: IV induction Ventilation: Mask ventilation without difficulty and Oral airway inserted - appropriate to patient size Laryngoscope Size: Mac and 4 Grade View: Grade I Tube type: Oral Endobronchial tube: EBT position confirmed by fiberoptic bronchoscope, Left, Double lumen EBT and EBT position confirmed by auscultation and 39 Fr Number of attempts: 2 (grade 1 view with first DL, however unable to pass DL ETT thru cords. Head repositioned, pillows removed, ETT thru cords without diff on second DL) Airway Equipment and Method: Stylet Placement Confirmation: ETT inserted through vocal cords under direct vision,  breath sounds checked- equal and bilateral and positive ETCO2 Tube secured with: Tape Dental Injury: Teeth and Oropharynx as per pre-operative assessment     RIJ CVP [id number]: The patient was identified and consent obtained.  TO was performed, and full barrier precautions were used.  The skin was anesthetized with lidocaine.  Once the vein was located with the 22 ga. needle using ultrasound guidance , the wire was inserted into the vein.  The wire location was confirmed with ultrasound.  The tissue was dilated and the catheter was carefully inserted, then sutured in place. A dressing was applied. The patient tolerated the procedure well.   CE

## 2012-01-20 ENCOUNTER — Inpatient Hospital Stay (HOSPITAL_COMMUNITY): Payer: Medicare Other

## 2012-01-20 DIAGNOSIS — M5104 Intervertebral disc disorders with myelopathy, thoracic region: Secondary | ICD-10-CM | POA: Diagnosis present

## 2012-01-20 LAB — BASIC METABOLIC PANEL
Calcium: 8.8 mg/dL (ref 8.4–10.5)
Creatinine, Ser: 0.96 mg/dL (ref 0.50–1.35)
GFR calc Af Amer: >90 mL/min (ref >90)
GFR calc non Af Amer: >90 mL/min (ref >90)
Sodium: 137 mEq/L (ref 135–145)

## 2012-01-20 LAB — CBC
Hemoglobin: 13.1 g/dL (ref 13.0–17.0)
MCH: 31.4 pg (ref 26.0–34.0)
MCV: 90.2 fL (ref 78.0–100.0)
Platelets: 198 10*3/uL (ref 150–400)
RBC: 4.17 MIL/uL — ABNORMAL LOW (ref 4.22–5.81)
RDW: 13.2 % (ref 11.5–15.5)

## 2012-01-20 LAB — BLOOD GAS, ARTERIAL
Bicarbonate: 27.5 mEq/L — ABNORMAL HIGH (ref 20.0–24.0)
Drawn by: 31288
O2 Content: 2 L/min
O2 Saturation: 98 %
Patient temperature: 98.6
pH, Arterial: 7.365 (ref 7.350–7.450)

## 2012-01-20 MED ORDER — WHITE PETROLATUM GEL
Status: AC
  Administered 2012-01-20: 1
  Filled 2012-01-20: qty 5

## 2012-01-20 MED ORDER — ALBUTEROL SULFATE HFA 108 (90 BASE) MCG/ACT IN AERS: 2.0000 | INHALATION_SPRAY | Freq: Four times a day (QID) | RESPIRATORY_TRACT | Status: DC | PRN

## 2012-01-20 NOTE — Progress Notes (Signed)
Nursing 1600 Dr. Edwyna Shell made aware of chest tube dressing change and that On Q pain pump was empty and discontinued.  No new orders.

## 2012-01-20 NOTE — Op Note (Signed)
Allen Fleming, DRUDGE NO.:  [id number]  MEDICAL RECORD NO.:  [id number]  LOCATION:  3104                         FACILITY:  [facility]  PHYSICIAN:  Ines Bloomer, M.D. DATE OF BIRTH:  10-Oct-1965  DATE OF PROCEDURE: DATE OF DISCHARGE:                              OPERATIVE REPORT   PREOPERATIVE DIAGNOSIS:  T9-10 herniated disk.  POSTOPERATIVE DIAGNOSIS:  T9-10 herniated disk.  OPERATION PERFORMED:  Right thoracotomy exposure up to T9-10 disk.  SURGEON:  Ines Bloomer, M.D.  FIRST ASSISTANT:  Doree Fudge, PA  ANESTHESIA:  General anesthesia with built in intubation.  After general anesthesia, the patient was intubated with a dual-lumen tube and the right lung was deflated.  He was turned to the right lateral thoracotomy position.  Dr. Franky Macho was going to do a T9-10 diskectomy, and we were asked to do the exposure.  A posterolateral thoracotomy incision was made over the 9th intercostal space.  The latissimus was divided and the serratus was reflected anteriorly.  The 9th intercostal space was entered with electrocautery and TVA placed in the pleuritic space.  We then divided the pleura posteriorly and dissected out the rib heads and the vertebral bodies of 9-10, 10-11, and 11-12.  We put a needle in to the 11-12 disk space and took pictures and then put another needle into the 9-10 disk space and took radiological pictures to confirm where the 9-10 disk space was, that was done by Dr. Franky Macho, then did diskectomy and we came back in to close.  Single On-Q was inserted in the usual fashion posteriorly.  Single chest tube was brought through a separate stab wound and tied in place with 0 silk.  Marcaine block was done in the usual fashion.  We drilled 4 holes on the 10th rib and passed the pericostal through the 10th rib and superiorly around the 9th rib.  The lung was re-expanded.  It had been collapsed during the procedure.  All retractors were  removed.  The chest was closed with those 4 pericostal #1 Vicryl in the muscle layer, 2-0 Vicryl in subcutaneous tissue, and Ethicon skin clips.  The patient had returned to recovery room in stable condition.     Ines Bloomer, M.D.     DPB/MEDQ  D:  01/19/2012  T:  01/19/2012  Job:  [id number]

## 2012-01-20 NOTE — Progress Notes (Signed)
Patient ID: Allen Fleming, male   DOB: Sep 11, 1965, 46 y.o.   MRN: [id number] BP 131/69  Pulse 89  Temp 98 F (36.7 C) (Oral)  Resp 14  Ht 6' (1.829 m)  Wt 133.8 kg (294 lb 15.6 oz)  BMI 40.01 kg/m2  SpO2 100% Alert and oriented x 4. Speech clear and fluent Moving lower extremities well. Dressing intact. Doing well neurologically.  May be out of bed.

## 2012-01-20 NOTE — Progress Notes (Addendum)
1 Day Post-Op Procedure(s) (LRB): TRANSTHORACIC DISCECTOMY (Right) THORACIC EXPOSURE (Right)  Subjective: Patient's incisional pain is fairly well controlled with PCA.  Objective: Vital signs in last 24 hours: Patient Vitals for the past 24 hrs:  BP Temp Temp src Pulse Resp SpO2 Height Weight  01/20/12 0600 131/78 mmHg - - 87  17  100 % - -  01/20/12 0500 117/71 mmHg - - 82  10  97 % - -  01/20/12 0400 129/71 mmHg 97.8 F (36.6 C) Oral 83  14  99 % - -  01/20/12 0300 128/67 mmHg - - 91  17  99 % - -  01/20/12 0227 - - - - 16  99 % - -  01/20/12 0200 130/61 mmHg - - 83  12  99 % - -  01/20/12 0100 139/63 mmHg - - 87  16  95 % - -  01/20/12 0000 151/64 mmHg 98.9 F (37.2 C) Axillary 92  14  100 % - -  01/19/12 2300 159/75 mmHg - - 89  17  96 % - -  01/19/12 2200 142/74 mmHg - - 91  17  99 % - -  01/19/12 2100 141/77 mmHg - - 84  15  100 % - -  01/19/12 2000 137/103 mmHg 99.1 F (37.3 C) Axillary 91  16  98 % - -  01/19/12 1900 118/61 mmHg - - 88  17  96 % - -  01/19/12 1800 125/59 mmHg - - 93  16  97 % 6' (1.829 m) 294 lb 15.6 oz (133.8 kg)  01/19/12 1700 - - - 88  16  100 % - -  01/19/12 1659 - 97 F (36.1 C) - - - - - -  01/19/12 1554 - 97.5 F (36.4 C) - - - - - -    Current Weight  01/19/12 294 lb 15.6 oz (133.8 kg)      Intake/Output from previous day: 06/18 0701 - 06/19 0700 In: 6465 [P.O.:1340; I.V.:4475; IV Piggyback:650] Out: 6600 [Urine:5750; Blood:500; Chest Tube:350]   Physical Exam:  Cardiovascular: RRR. Pulmonary: Clear to auscultation bilaterally; no rales, wheezes, or rhonchi. Abdomen: Soft, non tender, bowel sounds present. Wounds: Dressing is clean and dry.   Lab Results: CBC: Basename 01/20/12 0645  WBC 13.7*  HGB 13.1  HCT 37.6*  PLT 198   BMET:  Basename 01/20/12 0645  NA 137  K 4.4  CL 100  CO2 28  GLUCOSE 131*  BUN 11  CREATININE 0.96  CALCIUM 8.8    PT/INR: No results found for this basename: LABPROT,INR in the last 72  hours ABG:  INR: Will add last result for INR, ABG once components are confirmed Will add last 4 CBG results once components are confirmed  Assessment/Plan:  1.Pulmonary-Chest tube with 350 cc of output last 24 hours.There is no air leak.CXR this am is stable (no ptx) CT to be put to 10 cm suction this am.Check CXR in am.Encourage incentive spirometer. 2.Management per Dr. Alferd Apa MPA-C 01/20/2012

## 2012-01-20 NOTE — Evaluation (Signed)
Physical Therapy Evaluation Patient Details Name: Allen Fleming MRN: [id number] DOB: [birth year] Today's Date: 01/20/2012 Time: 7829-5621 PT Time Calculation (min): 35 min  PT Assessment / Plan / Recommendation Clinical Impression  Pt is 46 y/o male admitted for Right thoracotomy T9-10.  Pt very agreeable to get OOB and eager to ambulate.  Pt will benefit from acute PT services to improve overall mobility.    PT Assessment  Patient needs continued PT services    Follow Up Recommendations   (Will continue to assess d/c needs)    Barriers to Discharge        lEquipment Recommendations  Defer to next venue    Recommendations for Other Services     Frequency Min 5X/week    Precautions / Restrictions Precautions Precautions: Back Precaution Comments: Reviewed back precautions   Pertinent Vitals/Pain See vitals      Mobility  Bed Mobility Bed Mobility: Sit to Supine;Supine to Sit;Sitting - Scoot to Edge of Bed Supine to Sit: 4: Min guard;HOB elevated;With rails (HOB 90 degrees) Sitting - Scoot to Edge of Bed: 5: Supervision Details for Bed Mobility Assistance: HOB elevated due to chest tube Transfers Sit to Stand: 4: Min assist;From bed;With upper extremity assist Stand to Sit: 4: Min guard;To chair/3-in-1;With armrests;With upper extremity assist Details for Transfer Assistance: Cueing for safe hand placement and controlled descent Ambulation/Gait Ambulation/Gait Assistance: 4: Min assist Ambulation Distance (Feet): 80 Feet Assistive device: Rolling walker Ambulation/Gait Assistance Details: +2 (A) helpful due to multiple lines and chest tube.  (A) to manage RW with VCs for step sequence and upright posture. Gait Pattern: Step-through pattern;Decreased stride length;Shuffle;Wide base of support    Exercises     PT Diagnosis: Difficulty walking;Abnormality of gait;Generalized weakness;Acute pain  PT Problem List: Decreased activity tolerance;Decreased balance;Decreased  mobility;Decreased knowledge of use of DME;Pain PT Treatment Interventions: DME instruction;Gait training;Stair training;Functional mobility training;Therapeutic activities;Therapeutic exercise;Balance training;Patient/family education   PT Goals Acute Rehab PT Goals PT Goal Formulation: With patient Time For Goal Achievement: 01/27/12 Potential to Achieve Goals: Good Pt will go Supine/Side to Sit: with modified independence PT Goal: Supine/Side to Sit - Progress: Goal set today Pt will go Sit to Supine/Side: with modified independence PT Goal: Sit to Supine/Side - Progress: Goal set today Pt will go Sit to Stand: with modified independence PT Goal: Sit to Stand - Progress: Goal set today Pt will go Stand to Sit: with modified independence PT Goal: Stand to Sit - Progress: Goal set today Pt will Ambulate: >150 feet;with modified independence;with rolling walker PT Goal: Ambulate - Progress: Goal set today Pt will Go Up / Down Stairs: 1-2 stairs;with modified independence;with rolling walker PT Goal: Up/Down Stairs - Progress: Goal set today Additional Goals Additional Goal #1: Pt able to recall and adhere 3/3 back precautions. PT Goal: Additional Goal #1 - Progress: Goal set today  Visit Information  Last PT Received On: 01/20/12 Assistance Needed: +2 PT/OT Co-Evaluation/Treatment: Yes    Subjective Data  Subjective: "I can stand up for a long time.  My butt hurts." Patient Stated Goal: To go home   Prior Functioning  Home Living Lives With: Son (44 y.o.) Available Help at Discharge: Friend(s);Available PRN/intermittently Type of Home: Apartment Home Access: Stairs to enter Entrance Stairs-Number of Steps: 1 Entrance Stairs-Rails: None Home Layout: One level Bathroom Shower/Tub: Forensic scientist: Standard Bathroom Accessibility: No Home Adaptive Equipment: Walker - rolling;Tub transfer bench;Straight cane Prior Function Level of Independence:  Independent Driving: Yes Vocation: On disability  Cognition  Overall Cognitive Status: Appears within functional limits for tasks assessed/performed Arousal/Alertness: Awake/alert Orientation Level: Appears intact for tasks assessed Behavior During Session: [facility] for tasks performed    Extremity/Trunk Assessment Right Upper Extremity Assessment RUE ROM/Strength/Tone: Within functional levels Left Upper Extremity Assessment LUE ROM/Strength/Tone: Within functional levels Right Lower Extremity Assessment RLE ROM/Strength/Tone: Within functional levels Left Lower Extremity Assessment LLE ROM/Strength/Tone: Within functional levels   Balance Balance Balance Assessed: Yes Static Sitting Balance Static Sitting - Balance Support: Feet supported Static Sitting - Level of Assistance: 7: Independent Static Sitting - Comment/# of Minutes: ~5 min Static Standing Balance Static Standing - Balance Support: Right upper extremity supported Static Standing - Level of Assistance: 5: Stand by assistance Static Standing - Comment/# of Minutes: ~3 minutes  End of Session PT - End of Session Equipment Utilized During Treatment: Gait belt Activity Tolerance: Patient tolerated treatment well Patient left: in chair;with call bell/phone within reach Nurse Communication: Mobility status   Avalee Castrellon 01/20/2012, 4:18 PM Jake Shark, PT DPT [phone]

## 2012-01-20 NOTE — Progress Notes (Signed)
Occupational Therapy Evaluation Patient Details Name: Allen Fleming MRN: [id number] DOB: 01/06/1966 Today's Date: 01/20/2012 Time: 1425-1500 OT Time Calculation (min): 35 min  OT Assessment / Plan / Recommendation Clinical Impression  Pt s/p Right thoracotomy exposure up to T9-10 disk thus affecting PLOF.  Will benefit from acute OT to address below problem list in prep for d/c home.    OT Assessment  Patient needs continued OT Services    Follow Up Recommendations  Supervision - Intermittent;No OT follow up    Barriers to Discharge      Equipment Recommendations  Defer to next venue (3n1 TBD)    Recommendations for Other Services    Frequency  Min 2X/week    Precautions / Restrictions Precautions Precautions: Back Precaution Comments: Reviewed back precautions   Pertinent Vitals/Pain See vitals    ADL  Upper Body Dressing: Performed;Minimal assistance Where Assessed - Upper Body Dressing: Unsupported sitting Lower Body Dressing: Simulated;Moderate assistance Where Assessed - Lower Body Dressing: Sopported sit to stand Toilet Transfer: Simulated;Minimal assistance Toilet Transfer Method:  (ambulating) Toilet Transfer Equipment: Other (comment) (EOB to chair) Equipment Used: Gait belt;Rolling walker Transfers/Ambulation Related to ADLs: Min guard/ intermittent min assist with RW ADL Comments: Educated on performing LB tasks while maintaining back precautions.    OT Diagnosis: Acute pain;Generalized weakness  OT Problem List: Decreased activity tolerance;Decreased knowledge of use of DME or AE;Decreased knowledge of precautions;Pain OT Treatment Interventions: Self-care/ADL training;DME and/or AE instruction;Therapeutic activities;Patient/family education   OT Goals Acute Rehab OT Goals OT Goal Formulation: With patient Time For Goal Achievement: 01/27/12 Potential to Achieve Goals: Good ADL Goals Pt Will Perform Grooming: with modified independence;Standing at  sink ADL Goal: Grooming - Progress: Goal set today Pt Will Perform Upper Body Dressing: with modified independence;Sitting, chair;Sitting, bed ADL Goal: Upper Body Dressing - Progress: Goal set today Pt Will Perform Lower Body Dressing: with modified independence;Sit to stand from chair;Sit to stand from bed ADL Goal: Lower Body Dressing - Progress: Goal set today Pt Will Transfer to Toilet: with modified independence;with DME;Ambulation;Comfort height toilet;Maintaining back safety precautions ADL Goal: Toilet Transfer - Progress: Goal set today Pt Will Perform Tub/Shower Transfer: Tub transfer;Transfer tub bench;with DME;Ambulation;Maintaining back safety precautions ADL Goal: Tub/Shower Transfer - Progress: Goal set today Additional ADL Goal #1: Pt will independently generalize back precautions during ADL activity. ADL Goal: Additional Goal #1 - Progress: Goal set today  Visit Information  Last OT Received On: 01/20/12 Assistance Needed: +2 PT/OT Co-Evaluation/Treatment: Yes    Subjective Data      Prior Functioning  Home Living Lives With: Son (28 y.o.) Available Help at Discharge: Friend(s);Available PRN/intermittently Type of Home: Apartment Home Access: Stairs to enter Entrance Stairs-Number of Steps: 1 Entrance Stairs-Rails: None Home Layout: One level Bathroom Shower/Tub: Forensic scientist: Standard Bathroom Accessibility: No Home Adaptive Equipment: Walker - rolling;Tub transfer bench;Straight cane Prior Function Level of Independence: Independent Driving: Yes Vocation: On disability    Cognition  Overall Cognitive Status: Appears within functional limits for tasks assessed/performed Arousal/Alertness: Awake/alert Orientation Level: Appears intact for tasks assessed Behavior During Session: [facility] for tasks performed    Extremity/Trunk Assessment Right Upper Extremity Assessment RUE ROM/Strength/Tone: Within functional levels Left Upper  Extremity Assessment LUE ROM/Strength/Tone: Within functional levels Right Lower Extremity Assessment RLE ROM/Strength/Tone: Within functional levels Left Lower Extremity Assessment LLE ROM/Strength/Tone: Within functional levels   Mobility Bed Mobility Bed Mobility: Sit to Supine;Supine to Sit;Sitting - Scoot to Edge of Bed Supine to Sit: 4: Min guard;HOB  elevated;With rails (HOB 90 degrees) Sitting - Scoot to Edge of Bed: 5: Supervision Details for Bed Mobility Assistance: HOB elevated due to chest tube Transfers Transfers: Sit to Stand;Stand to Sit Sit to Stand: 4: Min assist;From bed;With upper extremity assist Stand to Sit: 4: Min guard;To chair/3-in-1;With armrests;With upper extremity assist Details for Transfer Assistance: Cueing for safe hand placement and controlled descent   Exercise    Balance Balance Balance Assessed: Yes Static Sitting Balance Static Sitting - Balance Support: Feet supported Static Sitting - Level of Assistance: 7: Independent Static Sitting - Comment/# of Minutes: ~5 min Static Standing Balance Static Standing - Balance Support: Right upper extremity supported Static Standing - Level of Assistance: 5: Stand by assistance Static Standing - Comment/# of Minutes: ~3 minutes  End of Session OT - End of Session Equipment Utilized During Treatment: Gait belt Activity Tolerance: Patient tolerated treatment well Patient left: in chair;with call bell/phone within reach Nurse Communication: Mobility status  01/20/2012 Cipriano Mile OTR/L Pager [phone] Office [phone]  Cipriano Mile 01/20/2012, 4:32 PM

## 2012-01-20 NOTE — Progress Notes (Signed)
  Pt with heavy drainage around insertion site of On-Q pump. On-Q pump empty,  no medication observed in bag.  Pump d/c'd by Osker Mason. Catheter intact upon removal. Dressing changed with 4x4 gauze.  Will continue to monitor.

## 2012-01-20 NOTE — Progress Notes (Signed)
                                              1 Day Post-Op Procedure(s) (LRB): TRANSTHORACIC DISCECTOMY (Right) THORACIC EXPOSURE (Right) Subjective: Stable. Moderate pain. Neuro same or improved. Drained 500 cc.CXR is stable with chest tube in place.Repeat lab in am. Repeat CXR am.   Objective: Vital signs in last 24 hours: Temp:  [97 F (36.1 C)-99.1 F (37.3 C)] 97.8 F (36.6 C) (06/19 0400) Pulse Rate:  [82-93] 87  (06/19 0600) Cardiac Rhythm:  [-] Normal sinus rhythm (06/19 0600) Resp:  [10-17] 17  (06/19 0600) BP: (117-159)/(59-103) 131/78 mmHg (06/19 0600) SpO2:  [95 %-100 %] 100 % (06/19 0600) Arterial Line BP: (119-180)/(76-146) 123/96 mmHg (06/19 0500) Weight:  [133.8 kg (294 lb 15.6 oz)] 133.8 kg (294 lb 15.6 oz) (06/18 1800)  Hemodynamic parameters for last 24 hours:    Intake/Output from previous day: 06/18 0701 - 06/19 0700 In: 6465 [P.O.:1340; I.V.:4475; IV Piggyback:650] Out: 6600 [Urine:5750; Blood:500; Chest Tube:350] Intake/Output this shift:    General appearance: alert Neurologic: intact Heart: regular rate and rhythm, S1, S2 normal, no murmur, click, rub or gallop Lungs: clear to auscultation bilaterally  Lab Results:  Basename 01/20/12 0645  WBC 13.7*  HGB 13.1  HCT 37.6*  PLT 198   BMET:  Basename 01/20/12 0645  NA 137  K 4.4  CL 100  CO2 28  GLUCOSE 131*  BUN 11  CREATININE 0.96  CALCIUM 8.8    PT/INR: No results found for this basename: LABPROT,INR in the last 72 hours ABG    Component Value Date/Time   PHART 7.365 01/20/2012 0335   HCO3 27.5* 01/20/2012 0335   TCO2 29.0 01/20/2012 0335   O2SAT 98.0 01/20/2012 0335   CBG (last 3)   Basename 01/19/12 0640  GLUCAP 125*    Assessment/Plan: S/P Procedure(s) (LRB): TRANSTHORACIC DISCECTOMY (Right) THORACIC EXPOSURE (Right) DC art line    LOS: 1 day    Allie Ousley,Kasch PATRICK 01/20/2012

## 2012-01-21 ENCOUNTER — Inpatient Hospital Stay (HOSPITAL_COMMUNITY): Payer: Medicare Other

## 2012-01-21 LAB — CBC
MCV: 91.9 fL (ref 78.0–100.0)
Platelets: 190 10*3/uL (ref 150–400)
RDW: 13.5 % (ref 11.5–15.5)
WBC: 14.8 10*3/uL — ABNORMAL HIGH (ref 4.0–10.5)

## 2012-01-21 LAB — COMPREHENSIVE METABOLIC PANEL
AST: 108 U/L — ABNORMAL HIGH (ref 0–37)
Albumin: 3.4 g/dL — ABNORMAL LOW (ref 3.5–5.2)
Calcium: 8.4 mg/dL (ref 8.4–10.5)
Chloride: 101 mEq/L (ref 96–112)
Creatinine, Ser: 0.94 mg/dL (ref 0.50–1.35)
Total Bilirubin: 0.3 mg/dL (ref 0.3–1.2)
Total Protein: 6.1 g/dL (ref 6.0–8.3)

## 2012-01-21 IMAGING — CR DG LUMBAR SPINE 2-3V
1 series · 1 of 1 positions shown · non-contrast
Comparison: None.

CLINICAL DATA: Left L3-4 far lateral discectomy

LUMBAR SPINE - 2-3 VIEW

[view not recorded]
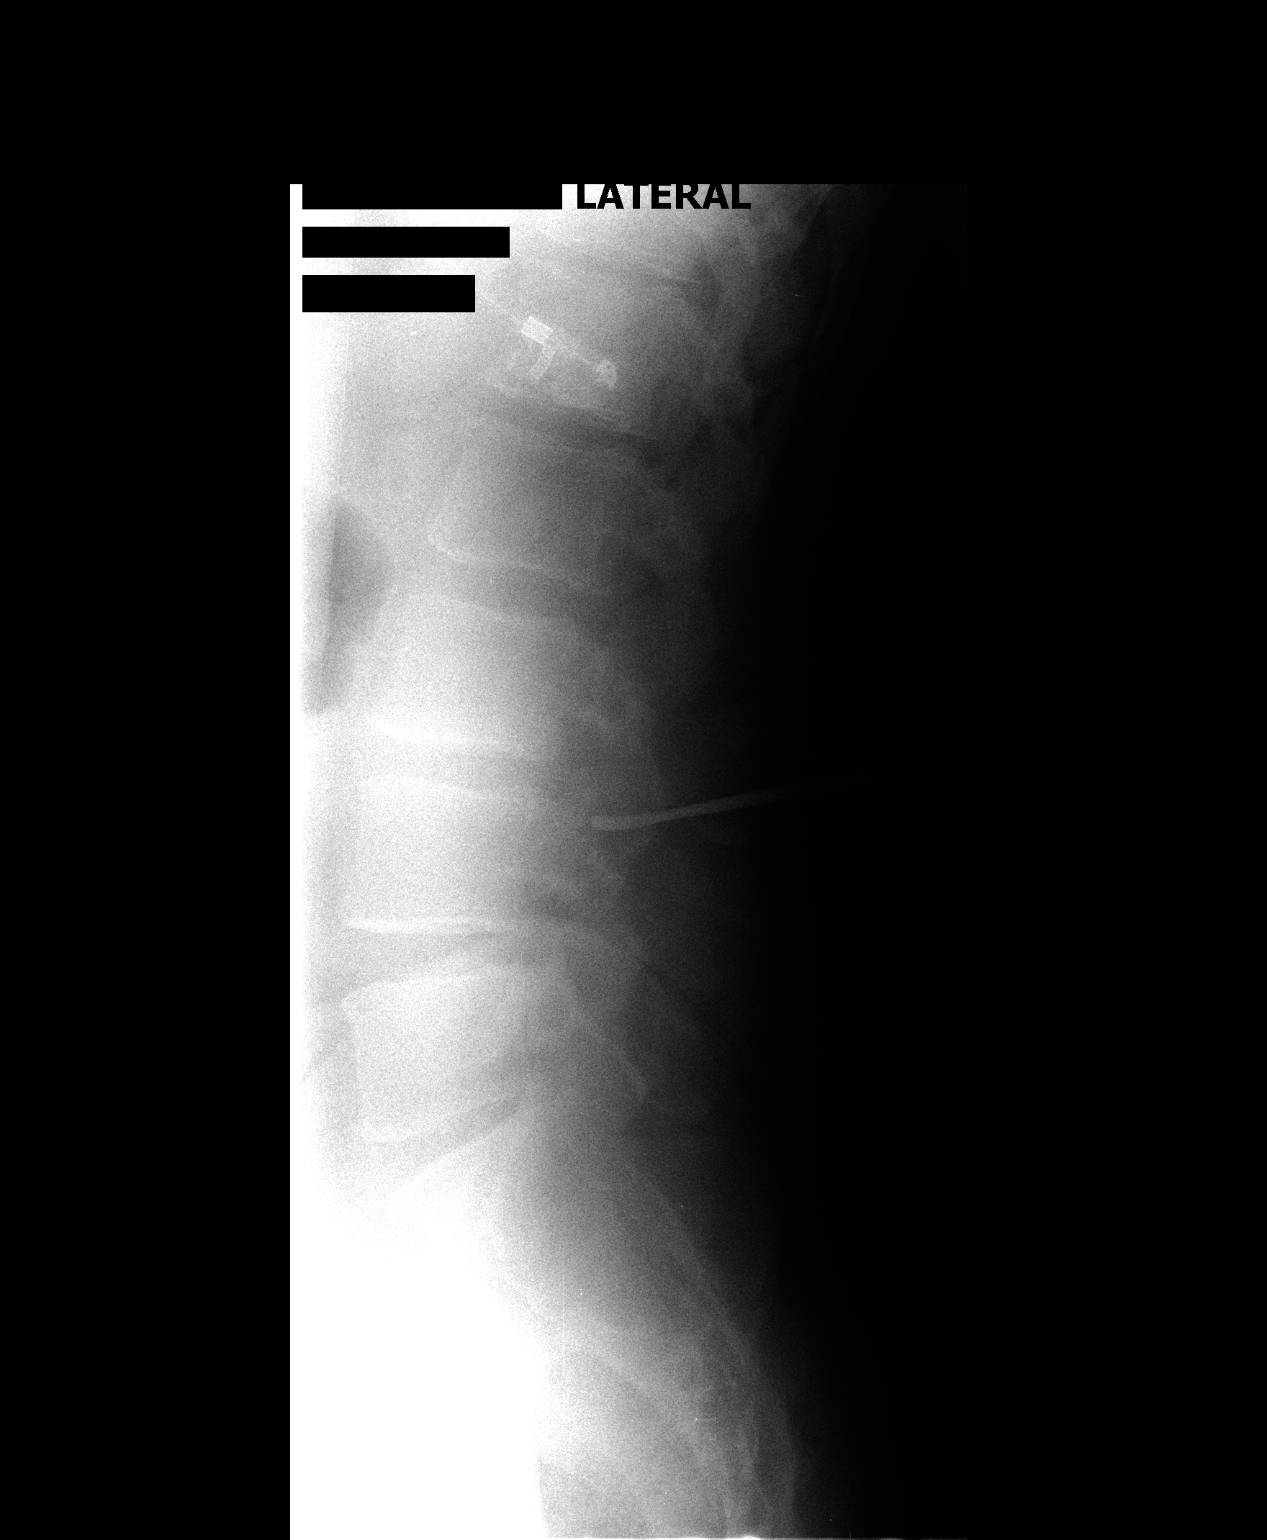

[1 of 1 positions shown; findings below may reference images not displayed]

FINDINGS: Cross-table lateral view #1 was taken at 7602 hours
revealing needle pointer posteriorly, aimed at the L2-3 interspace.
Film #2 was taken at 3999 hours revealing instrument pointer
posteriorly aimed at the superior aspect of the L4 body.

Film #3 was taken at 5539 hours revealing instrument pointer
posteriorly aimed at the inferior aspect of L3 body.  It projects
just posterior to the L3-4 foramen on the lateral view.
IMPRESSION: Localization at the inferior L3 level on the final view.

## 2012-01-21 NOTE — Progress Notes (Signed)
Subjective: Patient reports doing better  Objective: Vital signs in last 24 hours: Temp:  [97.6 F (36.4 C)-98.5 F (36.9 C)] 97.6 F (36.4 C) (06/20 1200) Pulse Rate:  [83-100] 84  (06/20 1200) Resp:  [13-24] 21  (06/20 1300) BP: (104-161)/(56-83) 136/63 mmHg (06/20 1300) SpO2:  [93 %-100 %] 100 % (06/20 1300)  Intake/Output from previous day: 06/19 0701 - 06/20 0700 In: 3420 [P.O.:720; I.V.:2400; IV Piggyback:300] Out: 3090 [Urine:2400; Chest Tube:690] Intake/Output this shift: Total I/O In: 1543 [P.O.:840; I.V.:703] Out: 500 [Urine:500]  Neurologic: Mental status: Alert, oriented, thought content appropriate Motor: baseline strength in the lower extremities. right slightly weaker than right.  Lab Results:  [facility] 01/21/12 0515 01/20/12 0645  WBC 14.8* 13.7*  HGB 11.4* 13.1  HCT 33.9* 37.6*  PLT 190 198   BMET  Basename 01/21/12 0515 01/20/12 0645  NA 139 137  K 4.3 4.4  CL 101 100  CO2 31 28  GLUCOSE 132* 131*  BUN 14 11  CREATININE 0.94 0.96  CALCIUM 8.4 8.8    Studies/Results: Dg Chest Port 1 View  01/20/2012  *RADIOLOGY REPORT*  Clinical Data: Status post surgery.  Chest tube.  PORTABLE CHEST - 1 VIEW  Comparison: Portable chest 01/19/2012.  Findings: A right IJ line is stable.  A right-sided chest tube remains.  There is no significant pneumothorax.  Aeration of both lungs is improved.  Areas of linear atelectasis persist at the left base.  IMPRESSION:  1.  Stable right to sided chest tube without evidence for significant pneumothorax. 2.  Improved aeration with some persistent atelectasis at both lung bases.  Original Report Authenticated By: Jamesetta Orleans. MATTERN, M.D.   Dg Chest Portable 1 View  01/19/2012  *RADIOLOGY REPORT*  Clinical Data: Chest tube insertion.  Postop thoracic discectomy.  PORTABLE CHEST - 1 VIEW 01/19/2012 1621 hours:  Comparison: Two-view chest x-ray 05/04/2011.  Findings: Right chest tube in place with no visible pneumothorax.  Right jugular central venous catheter tip in the SVC.  Cardiac silhouette upper normal in size for the AP portable technique. Pulmonary venous hypertension without overt edema.  No localized airspace consolidation.  Suboptimal inspiration accounts for mild bibasilar atelectasis.  IMPRESSION:  1.  No pneumothorax with right chest tube in place. 2.  Right jugular central venous catheter tip in the SVC. 3.  Suboptimal inspiration accounts for mild bibasilar atelectasis. Pulmonary venous hypertension without overt edema.  Original Report Authenticated By: Arnell Sieving, M.D.    Assessment/Plan: CT out. Will transfer to floor. Doing very well overall.   LOS: 2 days     [name] L 01/21/2012, 3:21 PM

## 2012-01-21 NOTE — Progress Notes (Signed)
Order for CT removal.  Procedure explained to the pt.  CT removed per policy.  Pt tolerated well.  Roselie Awkward, RN

## 2012-01-21 NOTE — Progress Notes (Signed)
                                              2 Days Post-Op Procedure(s) (LRB): TRANSTHORACIC DISCECTOMY (Right) THORACIC EXPOSURE (Right) Subjective: Stable. CXR is satisfactory. Will DC chest tube. OK to get out of bed.  Objective: Vital signs in last 24 hours: Temp:  [97.7 F (36.5 C)-98.3 F (36.8 C)] 98 F (36.7 C) (06/20 0400) Pulse Rate:  [77-97] 87  (06/20 0400) Cardiac Rhythm:  [-] Normal sinus rhythm (06/20 0400) Resp:  [10-20] 19  (06/20 0400) BP: (104-143)/(50-83) 143/83 mmHg (06/20 0400) SpO2:  [93 %-100 %] 99 % (06/20 0400) Arterial Line BP: (81-115)/(63-96) 81/63 mmHg (06/19 1000)  Hemodynamic parameters for last 24 hours:    Intake/Output from previous day: 06/19 0701 - 06/20 0700 In: 3320 [P.O.:720; I.V.:2300; IV Piggyback:300] Out: 3090 [Urine:2400; Chest Tube:690] Intake/Output this shift: Total I/O In: 1820 [P.O.:720; I.V.:1100] Out: 1475 [Urine:1200; Chest Tube:275]  General appearance: alert Lungs: clear to auscultation bilaterally  Lab Results:  [facility] 01/21/12 0515 01/20/12 0645  WBC 14.8* 13.7*  HGB 11.4* 13.1  HCT 33.9* 37.6*  PLT 190 198   BMET:  Basename 01/21/12 0515 01/20/12 0645  NA 139 137  K 4.3 4.4  CL 101 100  CO2 31 28  GLUCOSE 132* 131*  BUN 14 11  CREATININE 0.94 0.96  CALCIUM 8.4 8.8    PT/INR: No results found for this basename: LABPROT,INR in the last 72 hours ABG    Component Value Date/Time   PHART 7.365 01/20/2012 0335   HCO3 27.5* 01/20/2012 0335   TCO2 29.0 01/20/2012 0335   O2SAT 98.0 01/20/2012 0335   CBG (last 3)   Basename 01/19/12 1604 01/19/12 0640  GLUCAP 97 125*    Assessment/Plan: S/P Procedure(s) (LRB): TRANSTHORACIC DISCECTOMY (Right) THORACIC EXPOSURE (Right) DC chest tube. ONQ lost. Ambulate   LOS: 2 days    Nesta Scaturro,[facility] 01/21/2012

## 2012-01-21 NOTE — Progress Notes (Signed)
OT Cancellation Note  Treatment cancelled today due to patient's refusal to participate. Patient states he wants to try to sleep. Encouraged to ambulate with staff. Will reattempt this PM as time allows.  01/21/2012 Cipriano Mile OTR/L Pager [phone] Office [phone]

## 2012-01-21 NOTE — Progress Notes (Signed)
Clinical Social Worker received referral for SNF placement. Case Management also received referral for home health services. Pt disposition unclear at this time. Per MD note, pt with improvement and plan is to transfer to floor. Per PT evaluation, recommendations dependent upon pt progress and OT evaluation recommends no OT follow up. Clinical Social Worker to follow progress to determine if Clinical Child psychotherapist intervention appropriate.  Jacklynn Lewis, MSW, LCSWA  Clinical Social Work [phone]

## 2012-01-21 NOTE — Progress Notes (Signed)
PT Cancellation Note  Treatment cancelled today due to patient's refusal to participate. Patient states he wants to try to sleep. Encouraged to ambulate with staff. Will reattempt later this PM if able.   Fredrich Birks 01/21/2012, 11:29 AM 01/21/2012 Fredrich Birks PTA [phone] pager [phone] office

## 2012-01-22 ENCOUNTER — Inpatient Hospital Stay (HOSPITAL_COMMUNITY): Payer: Medicare Other

## 2012-01-22 ENCOUNTER — Other Ambulatory Visit: Payer: Self-pay | Admitting: Thoracic Surgery

## 2012-01-22 DIAGNOSIS — M5124 Other intervertebral disc displacement, thoracic region: Secondary | ICD-10-CM

## 2012-01-22 LAB — TYPE AND SCREEN
Unit division: 0
Unit division: 0

## 2012-01-22 MED ORDER — OXYCODONE HCL 5 MG PO TABS
15.0000 mg | ORAL_TABLET | Freq: Four times a day (QID) | ORAL | Status: DC
  Administered 2012-01-22 – 2012-01-24 (×5): 15 mg via ORAL
  Filled 2012-01-22 (×3): qty 3

## 2012-01-22 NOTE — Progress Notes (Signed)
Physical Therapy Treatment Patient Details Name: Allen Fleming MRN: [id number] DOB: 02-14-1966 Today's Date: 01/22/2012 Time: 1478-2956 PT Time Calculation (min): 20 min  PT Assessment / Plan / Recommendation Comments on Treatment Session  Pt was able to ambulate and ascend/descend stairs to meet goals.  Pt able to verbalize proper back precautions.  Pt eyes were "feeling bad" today and he was distracted when verbalizing back precautions, but states that he knows them and follows them.    Follow Up Recommendations       Barriers to Discharge        Equipment Recommendations  None recommended by OT    Recommendations for Other Services    Frequency Min 5X/week   Plan All goals met and education completed, patient dischaged from PT services    Precautions / Restrictions Precautions Precautions: Back Precaution Comments: Pt maintained back precautions during bed mobility and gait/stair activities; Pt able to state 2/3 precautions and 3/3 with VC.   Restrictions Weight Bearing Restrictions: No   Pertinent Vitals/Pain     Mobility  Bed Mobility Supine to Sit: 6: Modified independent (Device/Increase time) Sitting - Scoot to Edge of Bed: 6: Modified independent (Device/Increase time) Details for Bed Mobility Assistance: pt was able to move from supine to sit following back precautions, using handrails for support.   Transfers Sit to Stand: 6: Modified independent (Device/Increase time) Stand to Sit: 6: Modified independent (Device/Increase time) Details for Transfer Assistance: patient able to demonstrate safe sit/stand technique without requiring VC Ambulation/Gait Ambulation/Gait Assistance: 5: Supervision Ambulation Distance (Feet): 250 Feet Assistive device: Rolling walker Ambulation/Gait Assistance Details: Pt able to ambulate to gym and back with appropriate gait Gait Pattern: Step-through pattern Stairs: Yes Stairs Assistance: 6: Modified independent (Device/Increase  time) Stairs Assistance Details (indicate cue type and reason): pt also did curb with RW with education for proper placement of RW. Stair Management Technique: Two rails;Step to pattern Number of Stairs: 5     Exercises     PT Diagnosis:    PT Problem List:   PT Treatment Interventions:     PT Goals Acute Rehab PT Goals PT Goal: Supine/Side to Sit - Progress: Met PT Goal: Sit to Supine/Side - Progress: Met PT Goal: Sit to Stand - Progress: Met PT Goal: Stand to Sit - Progress: Met PT Goal: Ambulate - Progress: Met PT Goal: Up/Down Stairs - Progress: Met Additional Goals PT Goal: Additional Goal #1 - Progress: Progressing toward goal  Visit Information  Last PT Received On: 01/22/12 Assistance Needed: +1    Subjective Data      Cognition  Overall Cognitive Status: Appears within functional limits for tasks assessed/performed Arousal/Alertness: Awake/alert Orientation Level: Appears intact for tasks assessed Behavior During Session: [facility] for tasks performed    Balance     End of Session      Ephraim Hamburger, SPTA  01/22/2012, 11:55 AM Mckinley Jewel, PTA

## 2012-01-22 NOTE — Progress Notes (Addendum)
3 Days Post-Op Procedure(s) (LRB): TRANSTHORACIC DISCECTOMY (Right) THORACIC EXPOSURE (Right)  Subjective: Patient washing up this am and is without complaints.  Objective: Vital signs in last 24 hours: Patient Vitals for the past 24 hrs:  BP Temp Temp src Pulse Resp SpO2  01/21/12 2139 137/83 mmHg 97.8 F (36.6 C) Oral 80  20  98 %  01/21/12 1838 139/86 mmHg 97.3 F (36.3 C) Oral 83  20  98 %  01/21/12 1630 140/89 mmHg - - - - -  01/21/12 1600 175/80 mmHg - - 84  18  100 %  01/21/12 1500 95/60 mmHg - - 80  15  97 %  01/21/12 1400 128/69 mmHg - - - 13  -  01/21/12 1300 136/63 mmHg - - - 21  100 %  01/21/12 1200 161/74 mmHg 97.6 F (36.4 C) Oral 84  13  100 %  01/21/12 1100 137/61 mmHg - - 87  18  100 %  01/21/12 1000 119/59 mmHg - - 94  24  94 %  01/21/12 0900 135/74 mmHg - - 100  16  97 %  01/21/12 0800 156/75 mmHg 98.5 F (36.9 C) Axillary 86  15  97 %    Current Weight  01/19/12 294 lb 15.6 oz (133.8 kg)      Intake/Output from previous day: 06/20 0701 - 06/21 0700 In: 1743 [P.O.:840; I.V.:903] Out: 900 [Urine:900]   Physical Exam:  Cardiovascular: RRR. Pulmonary: Slightly diminished at bases; no rales, wheezes, or rhonchi. Abdomen: Soft, non tender, bowel sounds present. Wounds: Clean and dry.  Lab Results: CBC:  Basename 01/21/12 0515 01/20/12 0645  WBC 14.8* 13.7*  HGB 11.4* 13.1  HCT 33.9* 37.6*  PLT 190 198   BMET:   Basename 01/21/12 0515 01/20/12 0645  NA 139 137  K 4.3 4.4  CL 101 100  CO2 31 28  GLUCOSE 132* 131*  BUN 14 11  CREATININE 0.94 0.96  CALCIUM 8.4 8.8    PT/INR: No results found for this basename: LABPROT,INR in the last 72 hours ABG:  INR: Will add last result for INR, ABG once components are confirmed Will add last 4 CBG results once components are confirmed  Assessment/Plan:  1.Pulmonary-Chest tube removed yesterday.CXR this am shows a small right apical pneumothorax and bibasilar atelectasis.Check CXR in  am.Encourage incentive spirometer. 2.Management per Dr. Alferd Apa MPA-C 01/22/2012   Repeat CXR am. OK to dc in am.

## 2012-01-22 NOTE — Progress Notes (Signed)
Clinical Social Worker reviewed chart. Noted PT/OT evaluation recommending no follow up necessary. Inappropriate Clinical Social Worker referral. Clinical Social Worker signing off at this time. Please re-consult if social work needs arise.   Jacklynn Lewis, MSW, LCSWA  Clinical Social Work [phone]

## 2012-01-22 NOTE — Care Management Note (Signed)
    Page 1 of 1   01/25/2012     9:57:32 AM   CARE MANAGEMENT NOTE 01/25/2012  Patient:  Allen Fleming, Allen Fleming   Account Number:  [id number]  Date Initiated:  01/20/2012  Documentation initiated by:  Jacquelynn Cree  Subjective/Objective Assessment:   Admitted postop rt thoracomtomy t9-10 . Lives with spouse.     Action/Plan:   PT eval-  OT eval   Anticipated DC Date:  01/23/2012   Anticipated DC Plan:  HOME W HOME HEALTH SERVICES      DC Planning Services  CM consult      Choice offered to / List presented to:  C-1 Patient        HH arranged  HH-2 PT  HH-3 OT      [facility] agency  [facility]   Status of service:  Completed, signed off Medicare Important Message given?   (If response is "NO", the following Medicare IM given date fields will be blank) Date Medicare IM given:   Date Additional Medicare IM given:    Discharge Disposition:  HOME W HOME HEALTH SERVICES  Per UR Regulation:  Reviewed for med. necessity/level of care/duration of stay  If discussed at Long Length of Stay Meetings, dates discussed:    Comments:  01/22/12 HHPT and HHOT ordered. Spoke with patient and his wife about HHC. They chose [facility] from the [facility] list of [facility] agencies.Contacted Donna at [facility] and requested HHPT and OT. Anticipating discharge 01/23/12. Jacquelynn Cree RN, BSn, CCM

## 2012-01-22 NOTE — Progress Notes (Signed)
Pt continues to c/o and call for eye pain.  Pt states his eyes just hurt and he is requesting they be checked.  Refusing eye drops or saline flushes.  VSS; BP 143/88, 71, 18, 95% RA.  Will continue to monitor.

## 2012-01-22 NOTE — Progress Notes (Signed)
Patient ID: Allen Fleming, male   DOB: Oct 12, 1965, 46 y.o.   MRN: [id number] BP 121/72  Pulse 78  Temp 98.1 F (36.7 C) (Oral)  Resp 18  Ht 6' (1.829 m)  Wt 133.8 kg (294 lb 15.6 oz)  BMI 40.01 kg/m2  SpO2 95% Alert and oriented x4. Speech clear and fluent. Moving lower extremities well.  Doing well.  Bowel, bladder function at baseline.

## 2012-01-22 NOTE — Progress Notes (Signed)
01/22/2012 Milana Kidney DPT PAGER: [phone] OFFICE: [phone]

## 2012-01-22 NOTE — Progress Notes (Signed)
Occupational Therapy Treatment Patient Details Name: Allen Fleming MRN: [id number] DOB: 14-Feb-1966 Today's Date: 01/22/2012 Time: 4098-1191 OT Time Calculation (min): 10 min  OT Assessment / Plan / Recommendation Comments on Treatment Session Pt able to safely demonstrate ADLs and functional transfers at mod I level.  No further acute OT needs.  Pt agreeable to OT sign off and reports that he has no further questions.    Follow Up Recommendations  Supervision - Intermittent;No OT follow up    Barriers to Discharge       Equipment Recommendations  None recommended by OT    Recommendations for Other Services    Frequency Min 2X/week   Plan Discharge plan remains appropriate;All goals met and education completed, patient discharged from OT services    Precautions / Restrictions Precautions Precautions: Back Precaution Comments: Pt maintained back precautions during bed mobility and gait/stair activities; Pt able to state 2/3 precautions and 3/3 with VC.   Restrictions Weight Bearing Restrictions: No   Pertinent Vitals/Pain See vitals    ADL  Lower Body Bathing: Simulated;Modified independent Where Assessed - Lower Body Bathing: Unsupported sit to stand Lower Body Dressing: Performed;Modified independent Where Assessed - Lower Body Dressing: Unsupported sit to stand Toilet Transfer: Simulated;Modified independent Toilet Transfer Method:  (ambulating) Tub/Shower Transfer: Performed;Modified independent Tub/Shower Transfer Method: Science writer: Counsellor Used: Rolling walker Transfers/Ambulation Related to ADLs: mod I with RW throughout therapy gym ADL Comments: Pt able to safely demonstrate tub transfer and LB bathing/dressing techniques while maintaining back precautions.  UB dressing and grooming not performed but pt able to verbalize proper technique to perform tasks while adhering to precautions.  Educated pt on having  girlfriend providing setup assist for pt with ADL/IADL items to maximize safety and independence during activity while pt is home alone during day.    OT Diagnosis:    OT Problem List:   OT Treatment Interventions:     OT Goals ADL Goals Pt Will Perform Grooming: with modified independence;Standing at sink ADL Goal: Grooming - Progress:  (not demonstrated but able to correctly verbalize) Pt Will Perform Upper Body Dressing: with modified independence;Sitting, chair;Sitting, bed ADL Goal: Upper Body Dressing - Progress:  (not demonstrated but able to correctly verbalize) Pt Will Perform Lower Body Dressing: with modified independence;Sit to stand from chair;Sit to stand from bed ADL Goal: Lower Body Dressing - Progress: Met Pt Will Transfer to Toilet: with modified independence;with DME;Ambulation;Comfort height toilet;Maintaining back safety precautions ADL Goal: Toilet Transfer - Progress: Met Pt Will Perform Tub/Shower Transfer: Tub transfer;Transfer tub bench;with DME;Ambulation;Maintaining back safety precautions ADL Goal: Tub/Shower Transfer - Progress: Met Additional ADL Goal #1: Pt will independently generalize back precautions during ADL activity. ADL Goal: Additional Goal #1 - Progress: Met  Visit Information  Last OT Received On: 01/22/12 Assistance Needed: +1    Subjective Data      Prior Functioning       Cognition  Overall Cognitive Status: Appears within functional limits for tasks assessed/performed Arousal/Alertness: Awake/alert Orientation Level: Appears intact for tasks assessed Behavior During Session: [facility] for tasks performed    Mobility Transfers Transfers: Sit to Stand;Stand to Sit Sit to Stand: 6: Modified independent (Device/Increase time);Other (comment);With upper extremity assist (from tub transfer bench) Stand to Sit: 6: Modified independent (Device/Increase time);Other (comment);With upper extremity assist (to tub transfer bench) Details for  Transfer Assistance: mod I for increased time    Exercises    Balance    End of Session OT -  End of Session Equipment Utilized During Treatment:  (RW) Activity Tolerance: Patient tolerated treatment well Patient left: Other (comment) (with PT in hallway) Nurse Communication: Mobility status  01/22/2012 Cipriano Mile OTR/L Pager [phone] Office [phone]  Cipriano Mile 01/22/2012, 11:32 AM

## 2012-01-23 ENCOUNTER — Inpatient Hospital Stay (HOSPITAL_COMMUNITY): Payer: Medicare Other

## 2012-01-23 LAB — GLUCOSE, CAPILLARY: Glucose-Capillary: 105 mg/dL — ABNORMAL HIGH (ref 70–99)

## 2012-01-23 NOTE — Progress Notes (Signed)
Filed Vitals:   01/22/12 1000 01/22/12 1400 01/22/12 2147 01/23/12 0536  BP: 129/78 121/72 101/65 121/74  Pulse: 74 78 81 80  Temp: 98.5 F (36.9 C) 98.1 F (36.7 C) 98 F (36.7 C) 97.5 F (36.4 C)  TempSrc: Oral Oral Oral Oral  Resp: 18 18 20 20   Height:      Weight:      SpO2: 96% 95%  98%    Patient has been up and out of bed some. Thoracotomy incision clean and dry. Dressing over chest tube site. Chest x-ray not yet done today, to be ordered by nursing staff. Patient be seen in followup by thoracic surgery service, awaiting clearance for discharge.  Plan: Chest x-ray to be done today. Thoracic surgery followup. Possible discharge over the next day or 2. Encouraged to ambulate in the halls in the meantime.  Hewitt Shorts, MD 01/23/2012, 9:01 AM

## 2012-01-23 NOTE — Progress Notes (Signed)
                                              4 Days Post-Op Procedure(s) (LRB): TRANSTHORACIC DISCECTOMY (Right) THORACIC EXPOSURE (Right) Subjective: CXR not done. Stable otherwise. Wound OK. OK to dc if CXR stable.  Objective: Vital signs in last 24 hours: Temp:  [97.5 F (36.4 C)-98.5 F (36.9 C)] 97.5 F (36.4 C) (06/22 0536) Pulse Rate:  [74-81] 80  (06/22 0536) Cardiac Rhythm:  [-]  Resp:  [18-20] 20  (06/22 0536) BP: (101-129)/(65-78) 121/74 mmHg (06/22 0536) SpO2:  [95 %-98 %] 98 % (06/22 0536)  Hemodynamic parameters for last 24 hours:    Intake/Output from previous day:   Intake/Output this shift:    General appearance: alert Lungs: clear to auscultation bilaterally  Lab Results:  Basename 01/21/12 0515  WBC 14.8*  HGB 11.4*  HCT 33.9*  PLT 190   BMET:  Basename 01/21/12 0515  NA 139  K 4.3  CL 101  CO2 31  GLUCOSE 132*  BUN 14  CREATININE 0.94  CALCIUM 8.4    PT/INR: No results found for this basename: LABPROT,INR in the last 72 hours ABG    Component Value Date/Time   PHART 7.365 01/20/2012 0335   HCO3 27.5* 01/20/2012 0335   TCO2 29.0 01/20/2012 0335   O2SAT 98.0 01/20/2012 0335   CBG (last 3)   Basename 01/20/12 1816  GLUCAP 131*    Assessment/Plan: S/P Procedure(s) (LRB): TRANSTHORACIC DISCECTOMY (Right) THORACIC EXPOSURE (Right) Ok to DC if CXR stable   LOS: 4 days    Allen Fleming,[facility] 01/23/2012

## 2012-01-24 MED ORDER — DEXAMETHASONE SODIUM PHOSPHATE 10 MG/ML IJ SOLN
10.0000 mg | Freq: Once | INTRAMUSCULAR | Status: AC
  Administered 2012-01-24: 10 mg via INTRAVENOUS
  Filled 2012-01-24: qty 1

## 2012-01-24 MED ORDER — HYDROCODONE-ACETAMINOPHEN 5-325 MG PO TABS: 1.0000 | ORAL_TABLET | ORAL | Status: DC | PRN

## 2012-01-24 NOTE — Discharge Summary (Signed)
Physician Discharge Summary  Patient ID: Allen Fleming MRN: [id number] DOB/AGE: 44-Mar-1967 44 y.o.  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 The patient tolerated the procedure well and was taken to the recovery room and then to the floor in stable condition. The hospital course was routine. There were no complications. The wound remained clean dry and intact. Pt had appropriate back soreness. No complaints of leg pain or new N/T/W. he had some improvement in his preoperative symptoms. He did complain of some headache early in the course, this improved by the time of discharge. Chest x-rays were evaluated by CT surgery and cleared prior to discharge. The patient remained afebrile with stable vital signs, and tolerated a regular diet. The patient continued to increase activities, and pain was well controlled with oral pain medications.   Consults: None  Significant Diagnostic Studies:  Results for orders placed during the hospital encounter of 01/19/12  GLUCOSE, CAPILLARY      Component Value Range   Glucose-Capillary 125 (*) 70 - 99 mg/dL  CBC      Component Value Range   WBC 13.7 (*) 4.0 - 10.5 K/uL   RBC 4.17 (*) 4.22 - 5.81 MIL/uL   Hemoglobin 13.1  13.0 - 17.0 g/dL   HCT 56.2 (*) 13.0 - 86.5 %   MCV 90.2  78.0 - 100.0 fL   MCH 31.4  26.0 - 34.0 pg   MCHC 34.8  30.0 - 36.0 g/dL   RDW 78.4  69.6 - 29.5 %   Platelets 198  150 - 400 K/uL  BLOOD GAS, ARTERIAL      Component Value Range   FIO2 NASAL CANNULA     O2 Content 2.0     pH, Arterial 7.365  7.350 - 7.450   pCO2 arterial 49.3 (*) 35.0 - 45.0 mmHg   pO2, Arterial 106.0 (*) 80.0 - 100.0 mmHg   Bicarbonate 27.5 (*) 20.0 - 24.0 mEq/L   TCO2 29.0  0 - 100 mmol/L   Acid-Base Excess 2.6 (*) 0.0 - 2.0 mmol/L   O2 Saturation 98.0     Patient temperature 98.6     Collection site A-LINE     Drawn by [phone]     Sample type ARTERIAL DRAW    BASIC METABOLIC PANEL      Component Value Range   Sodium 137  135 - 145 mEq/L   Potassium 4.4  3.5 - 5.1 mEq/L   Chloride 100  96 - 112 mEq/L   CO2 28  19 - 32 mEq/L   Glucose, Bld 131 (*) 70 - 99 mg/dL   BUN 11  6 - 23 mg/dL   Creatinine, Ser 2.44  0.50 - 1.35 mg/dL   Calcium 8.8  8.4 - 01.0 mg/dL   GFR calc non Af Amer >90  >90 mL/min   GFR calc Af Amer >90  >90 mL/min  GLUCOSE, CAPILLARY      Component Value Range   Glucose-Capillary 97  70 - 99 mg/dL  CBC      Component Value Range   WBC 14.8 (*) 4.0 - 10.5 K/uL   RBC 3.69 (*) 4.22 - 5.81 MIL/uL   Hemoglobin 11.4 (*) 13.0 - 17.0 g/dL   HCT 27.2 (*) 53.6 -  52.0 %   MCV 91.9  78.0 - 100.0 fL   MCH 30.9  26.0 - 34.0 pg   MCHC 33.6  30.0 - 36.0 g/dL   RDW 09.8  11.9 - 14.7 %   Platelets 190  150 - 400 K/uL  COMPREHENSIVE METABOLIC PANEL      Component Value Range   Sodium 139  135 - 145 mEq/L   Potassium 4.3  3.5 - 5.1 mEq/L   Chloride 101  96 - 112 mEq/L   CO2 31  19 - 32 mEq/L   Glucose, Bld 132 (*) 70 - 99 mg/dL   BUN 14  6 - 23 mg/dL   Creatinine, Ser 8.29  0.50 - 1.35 mg/dL   Calcium 8.4  8.4 - 56.2 mg/dL   Total Protein 6.1  6.0 - 8.3 g/dL   Albumin 3.4 (*) 3.5 - 5.2 g/dL   AST 130 (*) 0 - 37 U/L   ALT 83 (*) 0 - 53 U/L   Alkaline Phosphatase 37 (*) 39 - 117 U/L   Total Bilirubin 0.3  0.3 - 1.2 mg/dL   GFR calc non Af Amer >90  >90 mL/min   GFR calc Af Amer >90  >90 mL/min  GLUCOSE, CAPILLARY      Component Value Range   Glucose-Capillary 131 (*) 70 - 99 mg/dL  GLUCOSE, CAPILLARY      Component Value Range   Glucose-Capillary 105 (*) 70 - 99 mg/dL    Dg Chest 2 View  8/65/7846  *RADIOLOGY REPORT*  Clinical Data: Follow up right pneumothorax.  Recent right thoracotomy.  CHEST - 2 VIEW  Comparison:  Portable chest x-ray yesterday and dating back to 01/19/2012.  Findings: Stable small (10-15% or so) right apical pneumothorax. Stable small right pleural effusion and associated streaky opacities at the right lung base.  Left lung clear. Cardiomediastinal silhouette unremarkable and unchanged.  No new abnormalities.  IMPRESSION: Stable small (10-15% or so) right apical pneumothorax, small right pleural effusion, and associated streaky atelectasis and/or bronchopneumonia at the right lung base.  No new abnormalities.  Original Report Authenticated By: Arnell Sieving, M.D.   Dg Thoracic Spine 2 View  01/19/2012  *RADIOLOGY REPORT*  Clinical Data: T9-10 discectomy.  THORACIC SPINE - 2 VIEW  Comparison: MRI 01/27/2011.  Findings: First intraoperative view demonstrates localizing instrument directed the lower thoracic spine, likely the T11-12 level.  Second lateral intraoperative image demonstrates localizing instrument directed at what appears to be projected at the T9-10 level.  IMPRESSION: Intraoperative localization as above.  Original Report Authenticated By: Cyndie Chime, M.D.   Dg Chest Port 1 View  01/20/2012  *RADIOLOGY REPORT*  Clinical Data: Status post surgery.  Chest tube.  PORTABLE CHEST - 1 VIEW  Comparison: Portable chest 01/19/2012.  Findings: A right IJ line is stable.  A right-sided chest tube remains.  There is no significant pneumothorax.  Aeration of both lungs is improved.  Areas of linear atelectasis persist at the left base.  IMPRESSION:  1.  Stable right to sided chest tube without evidence for significant pneumothorax. 2.  Improved aeration with some persistent atelectasis at both lung bases.  Original Report Authenticated By: Jamesetta Orleans. MATTERN, M.D.   Dg Chest Portable 1 View  01/19/2012  *RADIOLOGY REPORT*  Clinical Data: Chest tube insertion.  Postop thoracic discectomy.  PORTABLE CHEST - 1 VIEW 01/19/2012 1621 hours:  Comparison: Two-view chest x-ray 05/04/2011.   Findings: Right chest tube in place with no visible pneumothorax. Right  jugular central venous catheter tip in the SVC.  Cardiac silhouette upper normal in size for the AP portable technique. Pulmonary venous hypertension without overt edema.  No localized airspace consolidation.  Suboptimal inspiration accounts for mild bibasilar atelectasis.  IMPRESSION:  1.  No pneumothorax with right chest tube in place. 2.  Right jugular central venous catheter tip in the SVC. 3.  Suboptimal inspiration accounts for mild bibasilar atelectasis. Pulmonary venous hypertension without overt edema.  Original Report Authenticated By: Arnell Sieving, M.D.   Dg C-arm 1-60 Min-no Report  01/19/2012  CLINICAL DATA: Thoracic 9-10 discectomy   C-ARM 1-60 MINUTES  Fluoroscopy was utilized by the requesting physician.  No radiographic  interpretation.     Dg Or Local Abdomen  01/19/2012  *RADIOLOGY REPORT*  Clinical Data: 46 year old male undergoing thoracotomy and thoracic spine surgery.  Query retained surgical instrument.  OR LOCAL ABDOMEN  Comparison: 1040 hours the same day and earlier.  Findings: Portable cross-table lateral view of the upper abdomen. No retained surgical instruments identified.  NG tube is in place. Tubing also projects over the right upper quadrant.  There is evidence of a right pneumothorax with deep sulcus sign. No definite pneumoperitoneum.  Reportedly, right chest tube is in place.  IMPRESSION: 1.  No retained surgical instrument. 2.  Evidence of right pneumothorax following thoracotomy. Reportedly, right chest tube is in place.  Called to OR 31 at the time of this report.  Original Report Authenticated By: Harley Hallmark, M.D.    Antibiotics:  Anti-infectives     Start     Dose/Rate Route Frequency Ordered Stop   01/19/12 2000   cefUROXime (ZINACEF) 1.5 g in dextrose 5 % 50 mL IVPB     Comments: ceFAZolin (ANCEF) IVPB 2 g/50 mL premix (g) 2 g Given 01/19/12 1440      2 g Given   0900        1.5 g 100 mL/hr over 30 Minutes Intravenous Every 12 hours 01/19/12 1726 01/20/12 0810   01/19/12 1432   ceFAZolin (ANCEF) 2-3 GM-% IVPB SOLR     Comments: HAYES, CHRISTINE: cabinet override         01/19/12 1432 01/20/12 0244   01/19/12 0710   bacitracin 95284 UNITS injection  Status:  Discontinued     Comments: KEY, JENNIFER: cabinet override         01/19/12 0710 01/19/12 0734   01/19/12 0000   ceFAZolin (ANCEF) IVPB 2 g/50 mL premix        2 g 100 mL/hr over 30 Minutes Intravenous 60 min pre-op 01/18/12 1459 01/19/12 1440          Discharge Exam: Blood pressure 118/66, pulse 90, temperature 97.4 F (36.3 C), temperature source Oral, resp. rate 22, height 6' (1.829 m), weight 133.8 kg (294 lb 15.6 oz), SpO2 100.00%. Neurologic: Grossly normal Incision okay  Discharge Medications:   Medication List  As of 01/24/2012  9:40 AM   STOP taking these medications         oxyCODONE 15 MG immediate release tablet         TAKE these medications         HYDROcodone-acetaminophen 5-325 MG per tablet   Commonly known as: NORCO   Take 1-2 tablets by mouth every 4 (four) hours as needed for pain.      metFORMIN 500 MG tablet   Commonly known as: GLUCOPHAGE   Take 500 mg by mouth 2 (two) times daily with a  meal.      oxyCODONE 40 MG 12 hr tablet   Commonly known as: OXYCONTIN   Take 40 mg by mouth every 12 (twelve) hours.      pravastatin 40 MG tablet   Commonly known as: PRAVACHOL   Take 40 mg by mouth daily.            Disposition: Home   Final Dx: Thoracic disc herniation with myelopathy  Discharge Orders    Future Appointments: Provider: Department: Dept Phone: Center:   01/27/2012 10:00 AM Ines Bloomer, MD [facility] [phone] [facility]     Future Orders Please Complete By Expires   Diet - low sodium heart healthy      Increase activity slowly      Driving Restrictions      Comments:   None until return appointment   Lifting restrictions      Comments:    Less than 8 pounds   Call MD for:  temperature >100.4      Call MD for:  persistant nausea and vomiting      Call MD for:  severe uncontrolled pain      Call MD for:  redness, tenderness, or signs of infection (pain, swelling, redness, odor or green/yellow discharge around incision site)      Call MD for:  difficulty breathing, headache or visual disturbances         Follow-up Information    Follow up with CABBELL,KYLE L, MD. Schedule an appointment as soon as possible for a visit in 2 weeks. (PA/LAT to be taken (at [facility] which is in same building as Dr. Scheryl Darter office)on 01/27/12 at 9:00am;Appointment with Dr. Edwyna Shell is on 01/27/2012 at 10:00 am)    Contact information:   [address] [place] [address] [phone]           Signed: Tia Alert 01/24/2012, 9:40 AM

## 2012-01-24 NOTE — Progress Notes (Addendum)
Patient awakened around 0400  Complaining of bilateral plantar numbness-  Event is of new onset, patient medicated with 10 mg Decadron per MD's order. Patient also verbalized headache and eye pain, but voiced pain relief after medication with routine oxycodone.

## 2012-01-24 NOTE — Progress Notes (Signed)
                                              5 Days Post-Op Procedure(s) (LRB): TRANSTHORACIC DISCECTOMY (Right) THORACIC EXPOSURE (Right) Subjective: CXR yesterday stable. Wounds OK. Increasing activity. Can be discharged from my standpoint.  Objective: Vital signs in last 24 hours: Temp:  [97.4 F (36.3 C)-98.8 F (37.1 C)] 97.4 F (36.3 C) (06/23 0209) Pulse Rate:  [70-90] 90  (06/23 0700) Cardiac Rhythm:  [-]  Resp:  [18-22] 22  (06/23 0700) BP: (115-135)/(66-79) 118/66 mmHg (06/23 0700) SpO2:  [95 %-100 %] 100 % (06/23 0700)  Hemodynamic parameters for last 24 hours:    Intake/Output from previous day:   Intake/Output this shift: Total I/O In: 118 [P.O.:118] Out: -   General appearance: alert Lungs: clear to auscultation bilaterally Wound: Wounds healing well.   Lab Results: No results found for this basename: WBC:2,HGB:2,HCT:2,PLT:2 in the last 72 hours BMET: No results found for this basename: NA:2,K:2,CL:2,CO2:2,GLUCOSE:2,BUN:2,CREATININE:2,CALCIUM:2 in the last 72 hours  PT/INR: No results found for this basename: LABPROT,INR in the last 72 hours ABG    Component Value Date/Time   PHART 7.365 01/20/2012 0335   HCO3 27.5* 01/20/2012 0335   TCO2 29.0 01/20/2012 0335   O2SAT 98.0 01/20/2012 0335   CBG (last 3)   Basename 01/23/12 1403  GLUCAP 105*    Assessment/Plan: S/P Procedure(s) (LRB): TRANSTHORACIC DISCECTOMY (Right) THORACIC EXPOSURE (Right) OK to discharge.   LOS: 5 days    Ainara Eldridge,Donn [facility] 01/24/2012

## 2012-01-27 ENCOUNTER — Ambulatory Visit (INDEPENDENT_AMBULATORY_CARE_PROVIDER_SITE_OTHER): Payer: Self-pay | Admitting: Thoracic Surgery

## 2012-01-27 ENCOUNTER — Encounter: Payer: Self-pay | Admitting: Thoracic Surgery

## 2012-01-27 ENCOUNTER — Ambulatory Visit
Admission: RE | Admit: 2012-01-27 | Discharge: 2012-01-27 | Disposition: A | Discharge status: Home / Self Care | Payer: Medicare Other | Source: Ambulatory Visit | Attending: Thoracic Surgery | Admitting: Thoracic Surgery

## 2012-01-27 VITALS — BP 129/80 | HR 96 | Resp 18 | Ht 72.0 in | Wt 290.0 lb

## 2012-01-27 DIAGNOSIS — M5124 Other intervertebral disc displacement, thoracic region: Secondary | ICD-10-CM

## 2012-01-27 DIAGNOSIS — Z09 Encounter for follow-up examination after completed treatment for conditions other than malignant neoplasm: Secondary | ICD-10-CM

## 2012-01-27 NOTE — Progress Notes (Signed)
HPI patient returns for followup today. He did not get his pain medication on discharge. I gave him a prescription for #60 of Percocet 5-325. If he requires any further pain medication he needs to obtain out from his neurosurgeon Dr.Cabbell. He will see his neurosurgeon in 3 weeks. His incisions are well-healed. chest x-ray shows small right pleural effusion. we removed his chest tube sutures. We will see him back again in 3 weeks   Current Outpatient Prescriptions  Medication Sig Dispense Refill  . metFORMIN (GLUCOPHAGE) 500 MG tablet Take 500 mg by mouth 2 (two) times daily with a meal.      . oxyCODONE (OXYCONTIN) 15 MG TB12 Take 15 mg by mouth every 12 (twelve) hours.      Marland Kitchen oxyCODONE (OXYCONTIN) 40 MG 12 hr tablet Take 40 mg by mouth every 12 (twelve) hours.      . pravastatin (PRAVACHOL) 40 MG tablet Take 40 mg by mouth daily.         Review of Systems: Unchanged   Physical Exam lungs are clear auscultation percussion incision is well healed   Diagnostic Tests: Chest x-ray shows small right pleural effusion   Impression: Status post right thoracotomy for T9 10 discectomy   Plan: Followup in 3 weeks with chest x-ray with physician assistant. If stable at that time can be discharged back to Dr. Franky Macho.

## 2012-01-28 ENCOUNTER — Ambulatory Visit (INDEPENDENT_AMBULATORY_CARE_PROVIDER_SITE_OTHER): Payer: Medicare Other

## 2012-01-28 DIAGNOSIS — M5124 Other intervertebral disc displacement, thoracic region: Secondary | ICD-10-CM

## 2012-01-28 DIAGNOSIS — Z5189 Encounter for other specified aftercare: Secondary | ICD-10-CM

## 2012-01-28 NOTE — Progress Notes (Unsigned)
Patient came in today concerned about a new bruise that has shown up below his incision site that wasn't there yesterday when he came in to see Dr. Edwyna Shell.  Examined the area and it was soft and there was no pain around the area.  Told patient to continue to watch area and to call if any more concerns.

## 2012-02-11 ENCOUNTER — Other Ambulatory Visit: Payer: Self-pay | Admitting: Thoracic Surgery

## 2012-02-11 DIAGNOSIS — M546 Pain in thoracic spine: Secondary | ICD-10-CM

## 2012-02-15 ENCOUNTER — Ambulatory Visit (INDEPENDENT_AMBULATORY_CARE_PROVIDER_SITE_OTHER): Payer: Self-pay | Admitting: Physician Assistant

## 2012-02-15 ENCOUNTER — Encounter: Payer: Self-pay | Admitting: Physician Assistant

## 2012-02-15 ENCOUNTER — Ambulatory Visit
Admission: RE | Admit: 2012-02-15 | Discharge: 2012-02-15 | Disposition: A | Discharge status: Home / Self Care | Payer: Medicare Other | Source: Ambulatory Visit | Attending: Thoracic Surgery | Admitting: Thoracic Surgery

## 2012-02-15 VITALS — BP 119/71 | HR 88 | Resp 20 | Ht 72.0 in | Wt 278.0 lb

## 2012-02-15 DIAGNOSIS — M5124 Other intervertebral disc displacement, thoracic region: Secondary | ICD-10-CM

## 2012-02-15 DIAGNOSIS — M546 Pain in thoracic spine: Secondary | ICD-10-CM

## 2012-02-15 DIAGNOSIS — Z09 Encounter for follow-up examination after completed treatment for conditions other than malignant neoplasm: Secondary | ICD-10-CM

## 2012-02-15 NOTE — Progress Notes (Signed)
  HPI:  Patient returns for follow up having undergone a right thoracotomy for transthoracic disk exposeure of T9 and T10 by Dr. Edwyna Shell on 01/19/2012. He has already seen Dr. Edwyna Shell but returns for further evaluation of a right pleural effusion. He has complaints of some pain near his right thoracotomy incision as well as shortness of breath.He denies any cough, fever, chills, or chest pain.   Current Outpatient Prescriptions  Medication Sig Dispense Refill  . metFORMIN (GLUCOPHAGE) 500 MG tablet Take 500 mg by mouth 2 (two) times daily with a meal.      . oxyCODONE (OXYCONTIN) 15 MG TB12 Take 15 mg by mouth every 12 (twelve) hours.      Marland Kitchen oxyCODONE (OXYCONTIN) 40 MG 12 hr tablet Take 40 mg by mouth every 12 (twelve) hours.      . pravastatin (PRAVACHOL) 40 MG tablet Take 40 mg by mouth daily.      Vital signs:   BP 119/71 HR 88, RR 20, and O2 sat 95% on room air Physical Exam: Cardiovascular:RRR Pulmonary:Diminished at right base;left lung clear. No wheezes, rales, or rhonchi Wound: Clean and dry. No signs of infection.  Diagnostic Tests: PA/LAT CXR done today show's an increase in the right pleural effusion and atelectasis, and the left lung is clear.  Impression and Plan: Dr. Tyrone Sage has seen and reviewed today's CXR. It has been discussed with the patient the need to undergo an US guided right thoracentesis in order to drain the enlarging right pleural effusion. He will return to our office in 1 weeks with a CXR.

## 2012-02-16 ENCOUNTER — Other Ambulatory Visit: Payer: Self-pay | Admitting: Thoracic Surgery

## 2012-02-16 ENCOUNTER — Ambulatory Visit (HOSPITAL_COMMUNITY)
Admission: RE | Admit: 2012-02-16 | Discharge: 2012-02-16 | Disposition: A | Discharge status: Home / Self Care | Payer: Medicare Other | Source: Ambulatory Visit | Attending: Radiology | Admitting: Radiology

## 2012-02-16 ENCOUNTER — Ambulatory Visit (HOSPITAL_COMMUNITY)
Admission: RE | Admit: 2012-02-16 | Discharge: 2012-02-16 | Disposition: A | Discharge status: Home / Self Care | Payer: Medicare Other | Source: Ambulatory Visit | Attending: Thoracic Surgery | Admitting: Thoracic Surgery

## 2012-02-16 DIAGNOSIS — J90 Pleural effusion, not elsewhere classified: Secondary | ICD-10-CM

## 2012-02-16 NOTE — Procedures (Signed)
Successful US guided right thoracentesis. Yielded 1.25L of blood tinged fluid. Pt tolerated procedure well. No immediate complications.  Specimen was not sent for labs. CXR ordered.  Brayton El PA-C 02/16/2012 2:53 PM

## 2012-02-17 ENCOUNTER — Telehealth (HOSPITAL_COMMUNITY): Payer: Self-pay | Admitting: *Deleted

## 2012-02-17 NOTE — Telephone Encounter (Signed)
Post procedure follow up call.  Spoke with pt, says no concerns or questions at this time

## 2012-02-18 ENCOUNTER — Other Ambulatory Visit: Payer: Self-pay | Admitting: Thoracic Surgery

## 2012-02-18 DIAGNOSIS — M546 Pain in thoracic spine: Secondary | ICD-10-CM

## 2012-02-22 ENCOUNTER — Ambulatory Visit
Admission: RE | Admit: 2012-02-22 | Discharge: 2012-02-22 | Disposition: A | Discharge status: Home / Self Care | Payer: Medicare Other | Source: Ambulatory Visit | Attending: Thoracic Surgery | Admitting: Thoracic Surgery

## 2012-02-22 ENCOUNTER — Ambulatory Visit (INDEPENDENT_AMBULATORY_CARE_PROVIDER_SITE_OTHER): Payer: Self-pay | Admitting: Surgical

## 2012-02-22 VITALS — BP 113/78 | HR 79 | Resp 18 | Ht 72.0 in | Wt 272.0 lb

## 2012-02-22 DIAGNOSIS — Z09 Encounter for follow-up examination after completed treatment for conditions other than malignant neoplasm: Secondary | ICD-10-CM

## 2012-02-22 DIAGNOSIS — M5124 Other intervertebral disc displacement, thoracic region: Secondary | ICD-10-CM

## 2012-02-22 DIAGNOSIS — M546 Pain in thoracic spine: Secondary | ICD-10-CM

## 2012-02-22 NOTE — Progress Notes (Signed)
PCP is Elizebeth Koller, PA Referring Provider is Carmela Hurt, MD  Chief Complaint  Patient presents with  . Follow-up    1wk f/u  with CXR S/P THORACOTOMY     HPI: The patient is seen on today's date in office followup for right thoracotomy for neurosurgical exposure. He underwent ultrasound-guided thoracentesis for a moderate-sized right pleural effusion on 02/16/2012 and they obtained 1.2 L of fluid . He is seen in followup today. He denies shortness of breath. He does have some mild discomfort in the anterior pectoral region.   Past Medical History  Diagnosis Date  . Thoracic stenosis   . Myelopathy of thoracic region   . Herniated disc   . Diabetes mellitus   . High cholesterol   . Heart murmur   . Shortness of breath   . Sleep apnea     CPAP SINCE 98  . Headache     Past Surgical History  Procedure Date  . Thoracic discectomy 02/21/2010    Dr Franky Macho  . Back surgery   . Ankle fracture surgery     RT    No family history on file.  Social History History  Substance Use Topics  . Smoking status: Never Smoker   . Smokeless tobacco: Never Used  . Alcohol Use: 2.4 oz/week    4 Cans of beer per week     5 drinks per week    Current Outpatient Prescriptions  Medication Sig Dispense Refill  . metFORMIN (GLUCOPHAGE) 500 MG tablet Take 500 mg by mouth 2 (two) times daily with a meal.      . oxyCODONE (OXYCONTIN) 15 MG TB12 Take 15 mg by mouth as needed. 1 tablet every 4-6 hours daily      . oxyCODONE (OXYCONTIN) 40 MG 12 hr tablet Take 40 mg by mouth every 12 (twelve) hours.      . pravastatin (PRAVACHOL) 40 MG tablet Take 40 mg by mouth daily.        Allergies  Allergen Reactions  . Morphine And Related Nausea Only    Review of Systems otherwise noncontributory  BP 113/78  Pulse 79  Resp 18  Ht 6' (1.829 m)  Wt 272 lb (123.378 kg)  BMI 36.89 kg/m2  SpO2 97% Physical Exam general-well-developed adult male in no acute distress Pulmonary exam-diminished  breath sounds right base Cardiac exam-regular rate and rhythm normal S1-S2 no rubs Incision-well-healed without evidence of infection Extremities no edema   Diagnostic Tests: A chest x-ray was obtained on today's date and reveals a small right pleural effusion which is slightly larger than previous exam   Impression: Doing well   Plan: We'll see in 2 weeks with repeat PA and lateral chest x-ray to evaluate the status of the pleural effusion. We will monitor conservatively at this time. If it re\re accumulates we'll consider other therapeutic modalities.

## 2012-02-29 ENCOUNTER — Other Ambulatory Visit: Payer: Self-pay | Admitting: Thoracic Surgery

## 2012-02-29 DIAGNOSIS — M5124 Other intervertebral disc displacement, thoracic region: Secondary | ICD-10-CM

## 2012-03-01 ENCOUNTER — Other Ambulatory Visit: Payer: Self-pay | Admitting: Thoracic Surgery

## 2012-03-01 DIAGNOSIS — M5124 Other intervertebral disc displacement, thoracic region: Secondary | ICD-10-CM

## 2012-03-07 ENCOUNTER — Ambulatory Visit (INDEPENDENT_AMBULATORY_CARE_PROVIDER_SITE_OTHER): Payer: Self-pay | Admitting: Physician Assistant

## 2012-03-07 ENCOUNTER — Ambulatory Visit
Admission: RE | Admit: 2012-03-07 | Discharge: 2012-03-07 | Disposition: A | Discharge status: Home / Self Care | Payer: Medicare Other | Source: Ambulatory Visit | Attending: Thoracic Surgery | Admitting: Thoracic Surgery

## 2012-03-07 VITALS — BP 128/80 | HR 70 | Resp 18 | Ht 72.0 in | Wt 272.0 lb

## 2012-03-07 DIAGNOSIS — M5124 Other intervertebral disc displacement, thoracic region: Secondary | ICD-10-CM

## 2012-03-07 DIAGNOSIS — J90 Pleural effusion, not elsewhere classified: Secondary | ICD-10-CM

## 2012-03-07 DIAGNOSIS — Z09 Encounter for follow-up examination after completed treatment for conditions other than malignant neoplasm: Secondary | ICD-10-CM

## 2012-03-07 NOTE — Progress Notes (Signed)
  HPI:  Allen Fleming reports for follow-up for a right sided pleural effusion.  He is S/P RIght sided Thoracotomy for exposure for neurosurgical procedure.  He has undergone Thoracentesis once on 02/16/2012 at which time 1.2L of fluid was removed and since then he has had some further accumulation of fluid.  Currently the patient is doing well.  He denies chest pain, shortness of breath, and orthopnea.   Current Outpatient Prescriptions  Medication Sig Dispense Refill  . metFORMIN (GLUCOPHAGE) 500 MG tablet Take 500 mg by mouth 2 (two) times daily with a meal.      . oxyCODONE (OXYCONTIN) 15 MG TB12 Take 15 mg by mouth as needed. 1 tablet every 4-6 hours daily      . oxyCODONE (OXYCONTIN) 40 MG 12 hr tablet Take 40 mg by mouth every 12 (twelve) hours.      . pravastatin (PRAVACHOL) 40 MG tablet Take 40 mg by mouth daily.        Physical Exam:  BP 128/80  Pulse 70  Resp 18  Ht 6' (1.829 m)  Wt 272 lb (123.378 kg)  BMI 36.89 kg/m2  SpO2 99%  Gen: no apparent distress Lungs: decreased right base Heart: RRR Skin: incision well healed  Diagnostic Tests:  CXR: stable appearance of right pleural fluid.  Impression: Allen Fleming has a stable right sided pleural effusion.  He is asymptomatic at this time.    Plan:  RTC in 1 month with repeat chest xray.  The patient was instructed to call the office should he develop progressive shortness of breath or orthopnea.

## 2012-04-05 ENCOUNTER — Ambulatory Visit (INDEPENDENT_AMBULATORY_CARE_PROVIDER_SITE_OTHER): Payer: Self-pay | Admitting: Surgical

## 2012-04-05 ENCOUNTER — Ambulatory Visit
Admission: RE | Admit: 2012-04-05 | Discharge: 2012-04-05 | Disposition: A | Discharge status: Home / Self Care | Payer: Medicare Other | Source: Ambulatory Visit | Attending: Thoracic Surgery | Admitting: Thoracic Surgery

## 2012-04-05 VITALS — BP 122/81 | HR 75 | Resp 16 | Ht 72.0 in | Wt 265.0 lb

## 2012-04-05 DIAGNOSIS — Z8709 Personal history of other diseases of the respiratory system: Secondary | ICD-10-CM

## 2012-04-05 DIAGNOSIS — Z09 Encounter for follow-up examination after completed treatment for conditions other than malignant neoplasm: Secondary | ICD-10-CM

## 2012-04-05 NOTE — Patient Instructions (Signed)
Follow-up when necessary

## 2012-04-05 NOTE — Progress Notes (Signed)
PCP is Elizebeth Koller, PA Referring Provider is Carmela Hurt, MD  Chief Complaint  Patient presents with  . Pleural Effusion    h/o right after thoracotomy exposure for neurosurgical exposure....cxr    HPI: Patient is seen in followup of a right pleural effusion following neurosurgical exposure via thoracotomy on 01/19/2012 by Dr. Edwyna Shell. He required ultrasound-guided thoracentesis on one occasion and we have been following by serial chest x-ray. Currently he denies any shortness of breath. He has follow up with neurosurgery and is doing well from that regard. He does walk with a cane. He has had no other issues related to his thoracotomy incision.   Past Medical History  Diagnosis Date  . Thoracic stenosis   . Myelopathy of thoracic region   . Herniated disc   . Diabetes mellitus   . High cholesterol   . Heart murmur   . Shortness of breath   . Sleep apnea     CPAP SINCE 98  . Headache     Past Surgical History  Procedure Date  . Thoracic discectomy 02/21/2010    Dr Franky Macho  . Back surgery   . Ankle fracture surgery     RT    No family history on file.  Social History History  Substance Use Topics  . Smoking status: Never Smoker   . Smokeless tobacco: Never Used  . Alcohol Use: 2.4 oz/week    4 Cans of beer per week     5 drinks per week    Current Outpatient Prescriptions  Medication Sig Dispense Refill  . metFORMIN (GLUCOPHAGE) 500 MG tablet Take 500 mg by mouth 2 (two) times daily with a meal.      . oxyCODONE (OXYCONTIN) 15 MG TB12 Take 15 mg by mouth as needed. 1 tablet every 4-6 hours daily      . oxyCODONE (OXYCONTIN) 40 MG 12 hr tablet Take 40 mg by mouth every 12 (twelve) hours.      . pravastatin (PRAVACHOL) 40 MG tablet Take 40 mg by mouth daily.        Allergies  Allergen Reactions  . Morphine And Related Nausea Only    Review of Systems otherwise noncontributory  BP 122/81  Pulse 75  Resp 16  Ht 6' (1.829 m)  Wt 265 lb (120.203 kg)  BMI  35.94 kg/m2  SpO2 98% Physical Exam Gen.-well-developed adult black male in no acute distress Pulmonary exam-clear lung fields throughout Cardiac exam-regular rate and rhythm normal S1-S2 Incision-well-healed without evidence of infection   Diagnostic Tests: A chest x-ray was obtained on today's date which reveals a very small right pleural effusion. This is improved since previous exam.   Impression: Continues to do well in his recovery following right thoracotomy for exposure for t 9-10 discectomy   Plan: We will now see him on a when necessary basis should he have any further development of symptomatic effusion or other thoracic surgical issues. He understands the plan and agrees.

## 2013-06-18 ENCOUNTER — Emergency Department (HOSPITAL_COMMUNITY)
Admission: EM | Admit: 2013-06-18 | Discharge: 2013-06-18 | Disposition: A | Discharge status: Home / Self Care | Payer: Medicare Other | Attending: Emergency Medicine | Admitting: Emergency Medicine

## 2013-06-18 ENCOUNTER — Encounter (HOSPITAL_COMMUNITY): Payer: Self-pay | Admitting: Emergency Medicine

## 2013-06-18 DIAGNOSIS — Z79899 Other long term (current) drug therapy: Secondary | ICD-10-CM | POA: Insufficient documentation

## 2013-06-18 DIAGNOSIS — M79609 Pain in unspecified limb: Secondary | ICD-10-CM | POA: Insufficient documentation

## 2013-06-18 DIAGNOSIS — E7800 Pure hypercholesterolemia, unspecified: Secondary | ICD-10-CM | POA: Insufficient documentation

## 2013-06-18 DIAGNOSIS — M79605 Pain in left leg: Secondary | ICD-10-CM

## 2013-06-18 DIAGNOSIS — E119 Type 2 diabetes mellitus without complications: Secondary | ICD-10-CM | POA: Insufficient documentation

## 2013-06-18 DIAGNOSIS — R011 Cardiac murmur, unspecified: Secondary | ICD-10-CM | POA: Insufficient documentation

## 2013-06-18 DIAGNOSIS — Z8739 Personal history of other diseases of the musculoskeletal system and connective tissue: Secondary | ICD-10-CM | POA: Insufficient documentation

## 2013-06-18 NOTE — ED Provider Notes (Signed)
History/physical exam/procedure(s) were performed by non-physician practitioner and as supervising physician I was immediately available for consultation/collaboration. I have reviewed all notes and am in agreement with care and plan.   David Rodriquez S Laurabeth Yip, MD 06/18/13 2246 

## 2013-06-18 NOTE — ED Notes (Addendum)
Pt states that he has been seen a couple of times [facility] ED for his leg pain. Pt states that his PCP states that his left leg has been hurting him for the past 4 weeks. Pt states that it starts in hip and it travels down to his toes. Pt fell asleep during triage (snoring audible.) pt states that he is on pain medication for previous injuries (oxycodone and oxycotton which is why he cannot get any other pain medication rxtion.) pt states when he lays flat the pain is worse. Pt fell asleep again mid sentence in triage and when spoken to will wake back up and finish sentence. Pt states he wants a steriod shot to help with pain. Pt states he already received a pain medication shot and it did not help. Pt fell sleep again and does not remember the name of medication received.

## 2013-06-18 NOTE — ED Provider Notes (Signed)
CSN: [id number]     Arrival date & time 06/18/13  1922 History   First MD Initiated Contact with Patient 06/18/13 1936      This chart was scribed for non-physician practitioner, Teressa Lower, NP working with Hilario Quarry, MD by Arlan Organ, ED Scribe. This patient was seen in room TR11C/TR11C and the patient's care was started at 8:01 PM.   Chief Complaint  Patient presents with  . Leg Pain    The history is provided by the patient. No language interpreter was used.   HPI Comments: TALLAN SANDOZ is a 47 y.o. male who presents to the Emergency Department complaining of left leg pain that started 4 weeks ago. He states the pain starts in his hip, and radiates down to his toes. He says laying flat worsens the pain. Pt says he is currently on oxycodone and oxycotton for previous injuries, and states he has been seen multiple times at [facility] ED for his leg pain. Relative states his last dose was at 2 PM today. Pt has a follow up appointment with Dr. Charlsie Merles on 06/20/13.   Past Medical History  Diagnosis Date  . Thoracic stenosis   . Myelopathy of thoracic region   . Herniated disc   . Diabetes mellitus   . High cholesterol   . Heart murmur   . Shortness of breath   . Sleep apnea     CPAP SINCE 98  . [id number](478.2)    Past Surgical History  Procedure Laterality Date  . Thoracic discectomy  02/21/2010    Dr Franky Macho  . Back surgery    . Ankle fracture surgery      RT   History reviewed. No pertinent family history. History  Substance Use Topics  . Smoking status: Never Smoker   . Smokeless tobacco: Never Used  . Alcohol Use: 2.4 oz/week    4 Cans of beer per week     Comment: 5 drinks per week    Review of Systems  Musculoskeletal: Positive for arthralgias (leg pain).  All other systems reviewed and are negative.    Allergies  Morphine and related  Home Medications   Current Outpatient Rx  Name  Route  Sig  Dispense  Refill  . cyclobenzaprine (FLEXERIL)  10 MG tablet   Oral   Take 10 mg by mouth 3 (three) times daily as needed for muscle spasms.         . metFORMIN (GLUCOPHAGE) 500 MG tablet   Oral   Take 500 mg by mouth 2 (two) times daily with a meal.         . oxyCODONE (OXYCONTIN) 15 MG TB12   Oral   Take 15 mg by mouth as needed. 1 tablet every 4-6 hours daily         . oxyCODONE (OXYCONTIN) 40 MG 12 hr tablet   Oral   Take 40 mg by mouth every 12 (twelve) hours.         . pravastatin (PRAVACHOL) 40 MG tablet   Oral   Take 40 mg by mouth daily.          Triage Vitals: BP 154/74  Pulse 90  Temp(Src) 97.9 F (36.6 C) (Oral)  Resp 16  SpO2 95%  Physical Exam  Nursing note and vitals reviewed. Constitutional: He is oriented to person, place, and time. He appears well-developed and well-nourished.  HENT:  Head: Normocephalic and atraumatic.  Eyes: EOM are normal.  Neck: Normal range of motion.  Pulmonary/Chest: Effort normal.  Musculoskeletal: Normal range of motion.  Neurological: He is alert and oriented to person, place, and time. Coordination normal.  Pt fall asleep when taking to you  Skin: Skin is warm and dry.  Psychiatric: He has a normal mood and affect. His behavior is normal.    ED Course  Procedures (including critical care time)  DIAGNOSTIC STUDIES: Oxygen Saturation is 95% on RA, Adequate by my interpretation.    COORDINATION OF CARE: 8:05 PM-Discussed treatment plan with pt at bedside and pt agreed to plan.     Labs Review Labs Reviewed - No data to display Imaging Review No results found.  EKG Interpretation   None       MDM   1. Leg pain, left    Pt has pain medication at home:pt is schedule to see Dr. Franky Macho in 2 days:discussed with family member that he doesn't need to have amy more pain medication or muscle relaxer tonight:pt is neurovascularly intact:no imaging is needed at this time  I personally performed the services described in this documentation, which was  scribed in my presence. The recorded information has been reviewed and is accurate.    Teressa Lower, NP 06/18/13 2144

## 2014-01-15 IMAGING — CR DG CHEST 1V PORT
1 series · 1 of 1 positions shown · non-contrast
Comparison: Two-view chest x-ray 05/04/2011.

CLINICAL DATA: Chest tube insertion.  Postop thoracic discectomy.

PORTABLE CHEST - 1 VIEW [DATE]/[id number] hours:

[view not recorded]
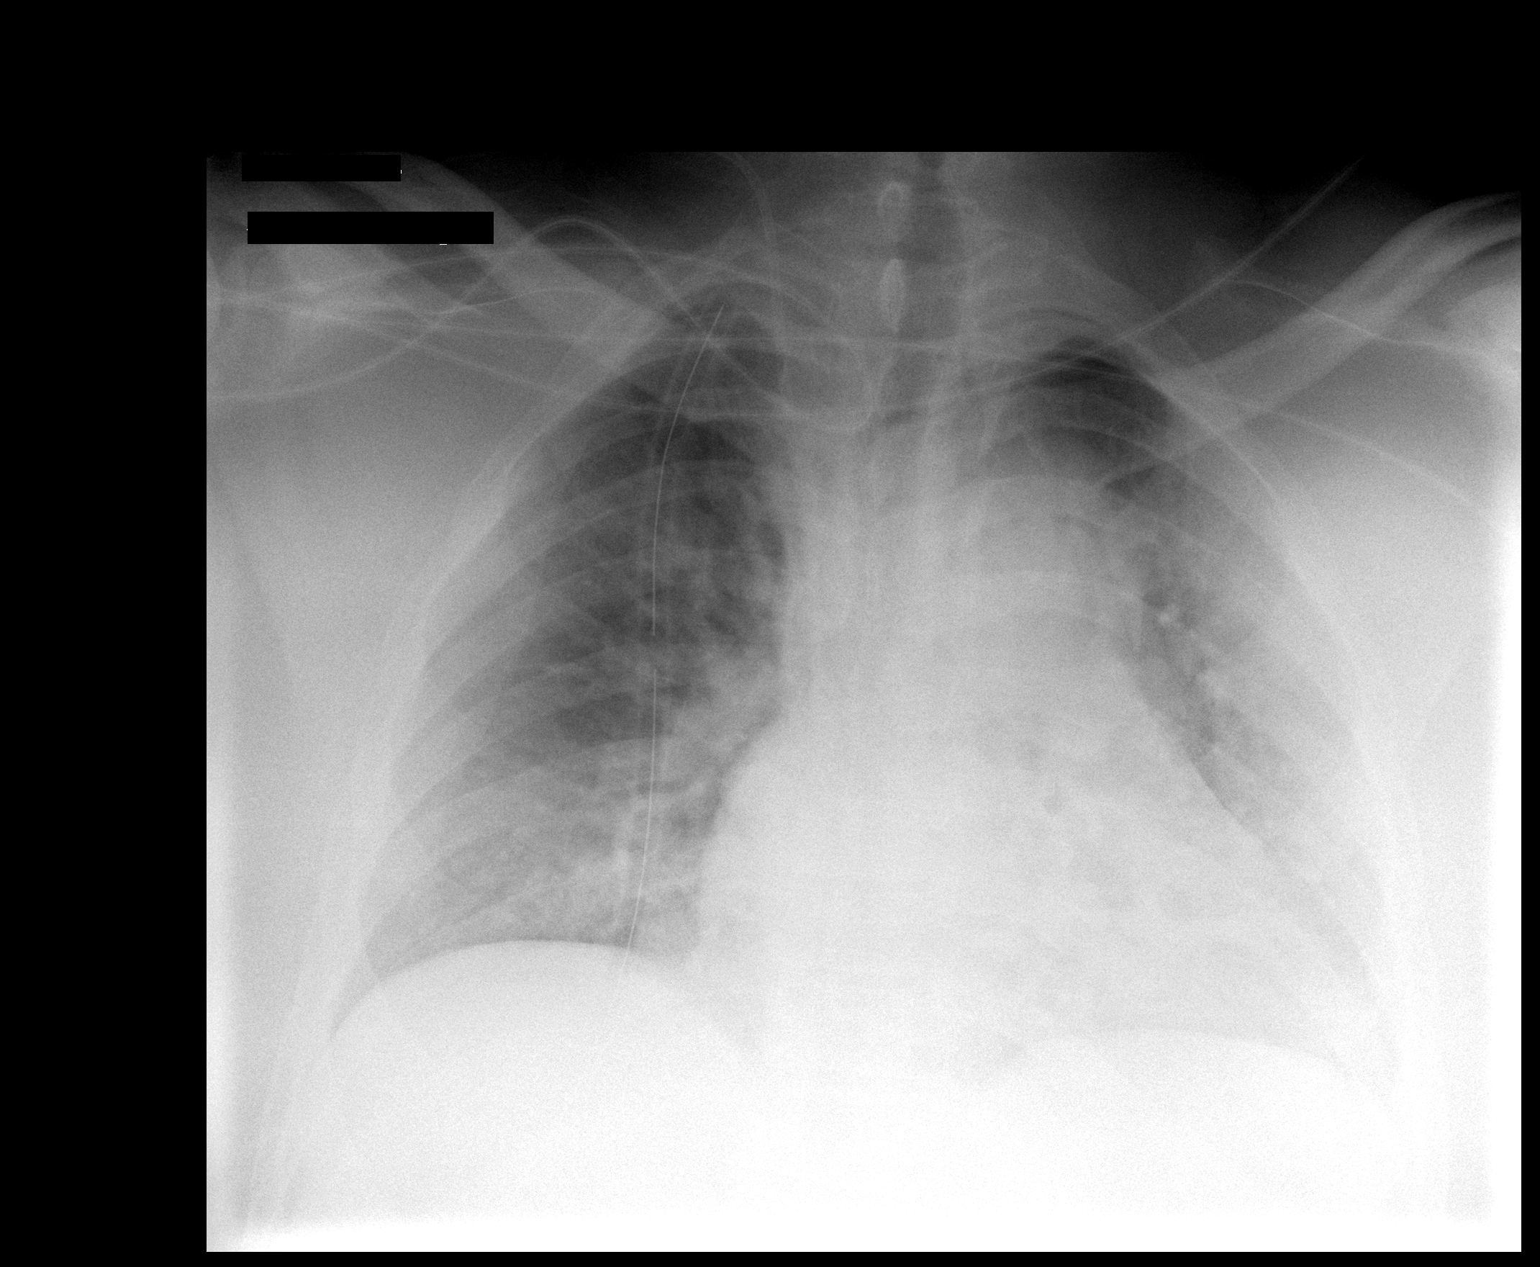

[1 of 1 positions shown; findings below may reference images not displayed]

FINDINGS: Right chest tube in place with no visible pneumothorax.
Right jugular central venous catheter tip in the SVC.  Cardiac
silhouette upper normal in size for the AP portable technique.
Pulmonary venous hypertension without overt edema.  No localized
airspace consolidation.  Suboptimal inspiration accounts for mild
bibasilar atelectasis.
IMPRESSION: 1.  No pneumothorax with right chest tube in place.
2.  Right jugular central venous catheter tip in the SVC.
3.  Suboptimal inspiration accounts for mild bibasilar atelectasis.
Pulmonary venous hypertension without overt edema.

## 2014-01-16 IMAGING — CR DG CHEST 1V PORT
1 series · 1 of 1 positions shown · non-contrast
Comparison: Portable chest 01/19/2012.

CLINICAL DATA: Status post surgery.  Chest tube.

PORTABLE CHEST - 1 VIEW

[AP]
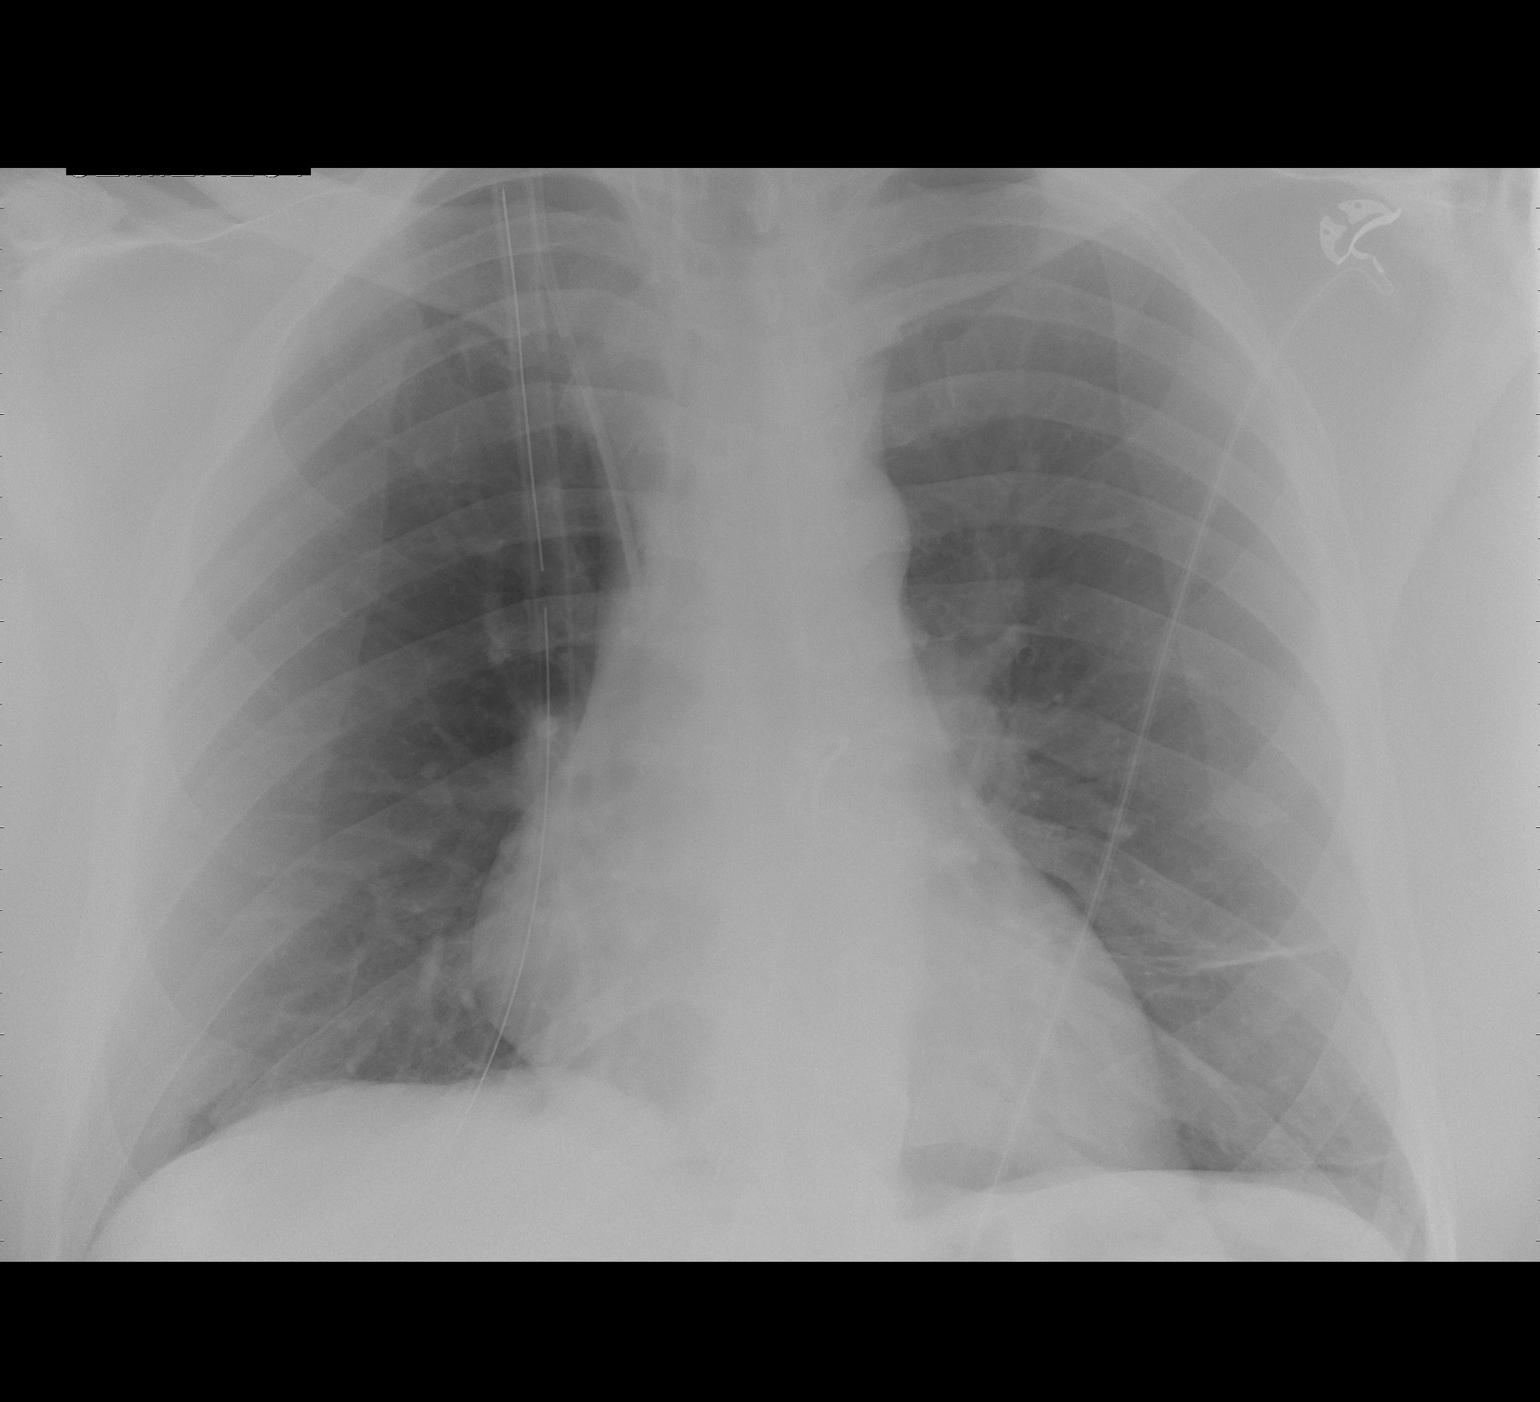

[1 of 1 positions shown; findings below may reference images not displayed]

FINDINGS: A right IJ line is stable.  A right-sided chest tube
remains.  There is no significant pneumothorax.  Aeration of both
lungs is improved.  Areas of linear atelectasis persist at the left
base.
IMPRESSION: 1.  Stable right to sided chest tube without evidence for
significant pneumothorax.
2.  Improved aeration with some persistent atelectasis at both lung
bases.

## 2014-03-04 IMAGING — CR DG CHEST 2V
2 series · 2 of 2 positions shown · non-contrast
Comparison: Chest x-ray of 02/22/2012 and 05/04/2011

CLINICAL DATA: Thoracic disc surgery in [REDACTED], follow-up

CHEST - 2 VIEW

[view not recorded (1 of 2)]
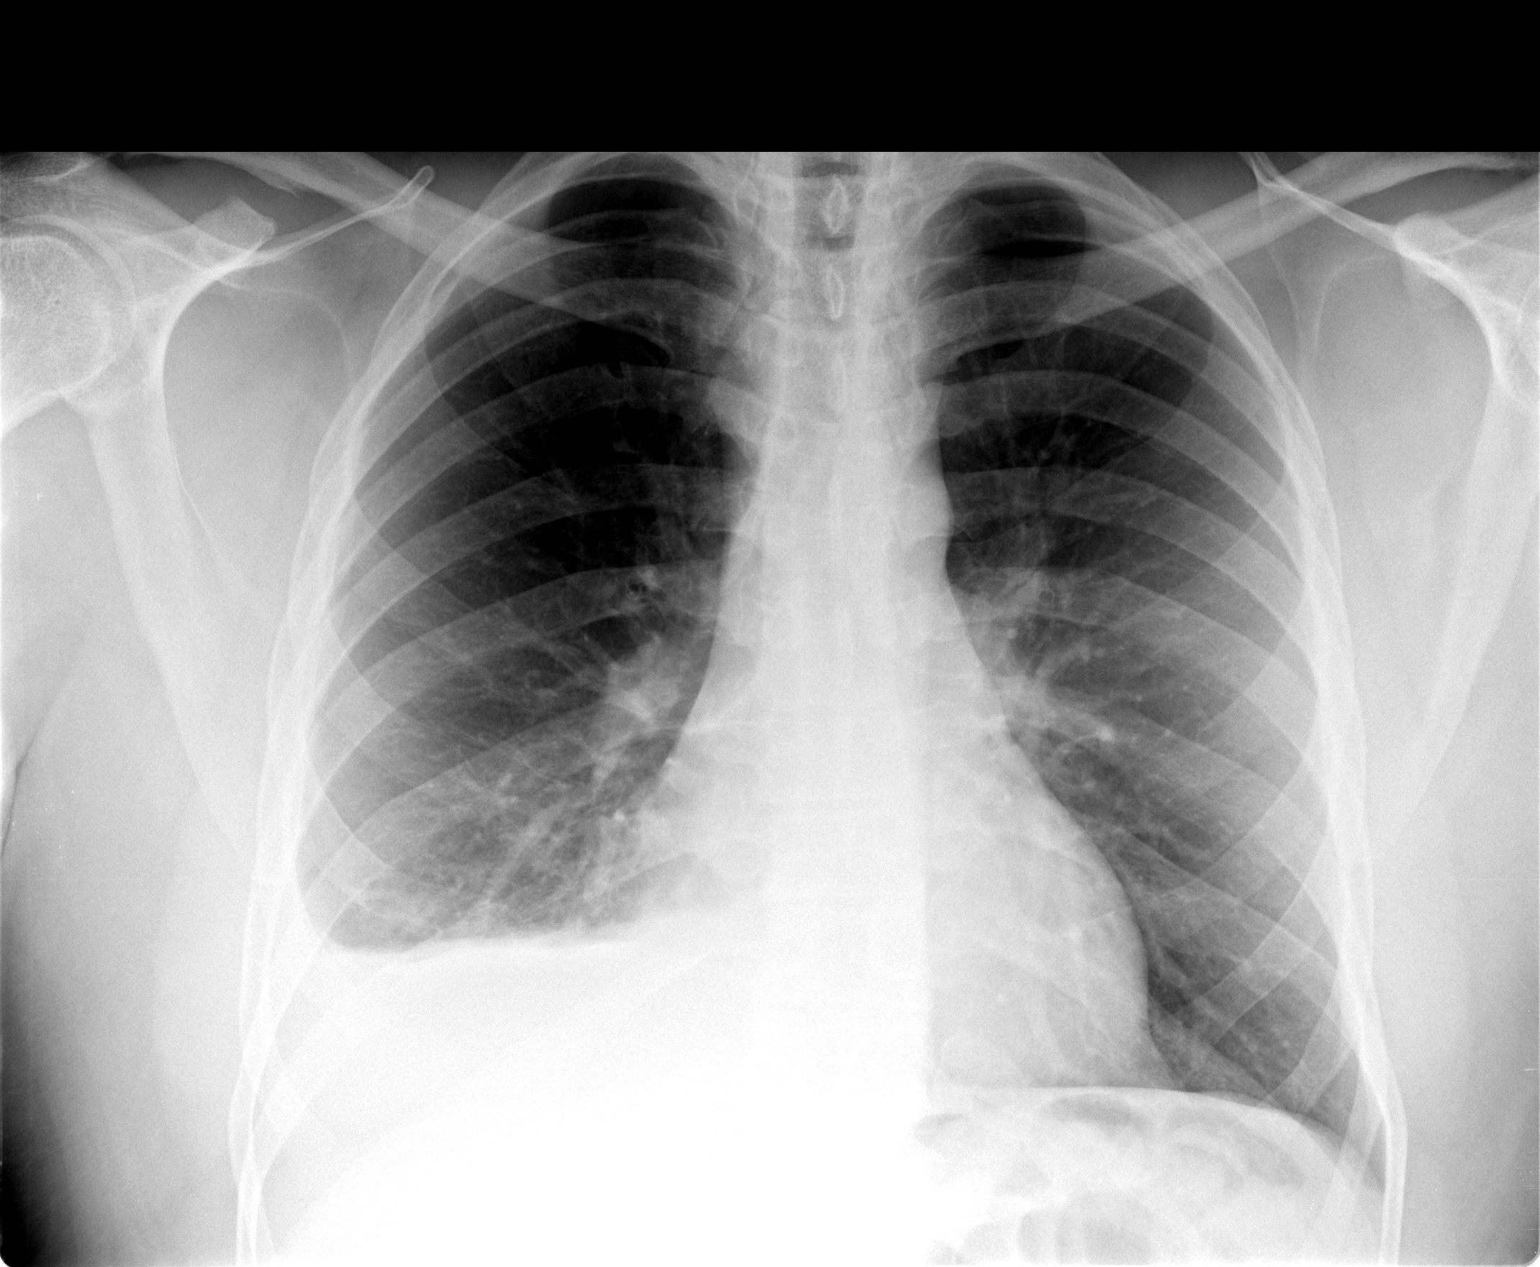

[view not recorded (2 of 2)]
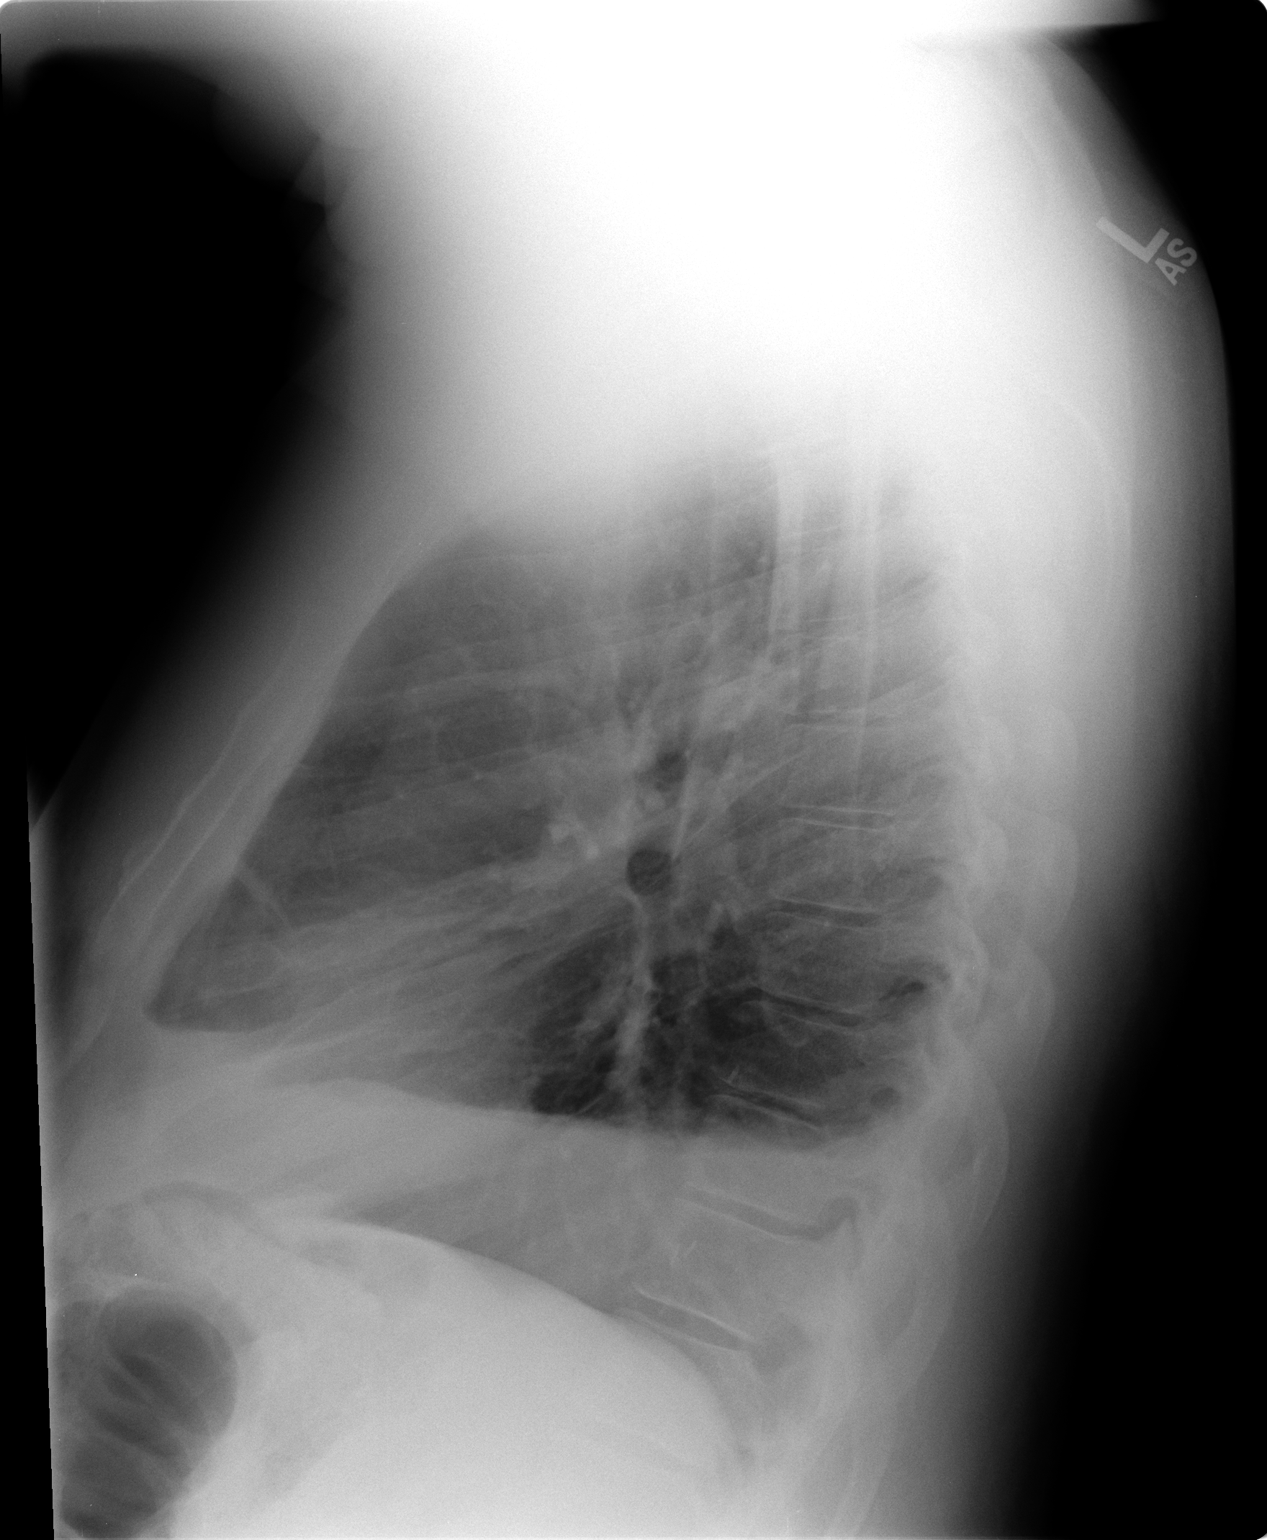

[2 of 2 positions shown; findings below may reference images not displayed]

FINDINGS: There is little change in opacity at the right lung base
consistent with right pleural effusion with right basilar volume
loss.  The left lung is clear.  Mediastinal contours are stable.
The heart is within normal limits in size.  No bony abnormality is
seen.
IMPRESSION: No change in opacity at the right lung base consistent with right
pleural effusion and right basilar atelectasis.

## 2014-08-22 ENCOUNTER — Other Ambulatory Visit (HOSPITAL_COMMUNITY): Payer: Self-pay | Admitting: Neurosurgery

## 2014-08-22 DIAGNOSIS — M4726 Other spondylosis with radiculopathy, lumbar region: Secondary | ICD-10-CM

## 2014-08-22 DIAGNOSIS — M4722 Other spondylosis with radiculopathy, cervical region: Secondary | ICD-10-CM

## 2014-09-05 ENCOUNTER — Ambulatory Visit (HOSPITAL_COMMUNITY)
Admission: RE | Admit: 2014-09-05 | Discharge: 2014-09-05 | Disposition: A | Discharge status: Home / Self Care | Payer: Medicare Other | Source: Ambulatory Visit | Attending: Neurosurgery | Admitting: Neurosurgery

## 2014-09-05 DIAGNOSIS — M4722 Other spondylosis with radiculopathy, cervical region: Secondary | ICD-10-CM

## 2014-09-05 DIAGNOSIS — M4726 Other spondylosis with radiculopathy, lumbar region: Secondary | ICD-10-CM

## 2014-09-05 DIAGNOSIS — M25552 Pain in left hip: Secondary | ICD-10-CM | POA: Insufficient documentation

## 2014-09-05 DIAGNOSIS — Z79899 Other long term (current) drug therapy: Secondary | ICD-10-CM | POA: Diagnosis not present

## 2014-09-05 DIAGNOSIS — M545 Low back pain: Secondary | ICD-10-CM | POA: Diagnosis present

## 2014-09-05 LAB — GLUCOSE, CAPILLARY: GLUCOSE-CAPILLARY: 140 mg/dL — AB (ref 70–99)

## 2014-09-05 MED ORDER — IOHEXOL 300 MG/ML  SOLN
10.0000 mL | Freq: Once | INTRAMUSCULAR | Status: AC | PRN
  Administered 2014-09-05: 10 mL via INTRATHECAL

## 2014-09-05 MED ORDER — ONDANSETRON HCL 4 MG/2ML IJ SOLN
4.0000 mg | Freq: Four times a day (QID) | INTRAMUSCULAR | Status: DC | PRN
Start: 2014-09-05 — End: 2014-09-06

## 2014-09-05 MED ORDER — DIAZEPAM 5 MG PO TABS
10.0000 mg | ORAL_TABLET | Freq: Once | ORAL | Status: AC
  Administered 2014-09-05: 10 mg via ORAL

## 2014-09-05 MED ORDER — DIAZEPAM 5 MG PO TABS
ORAL_TABLET | ORAL | Status: AC
  Administered 2014-09-05: 10 mg via ORAL
  Filled 2014-09-05: qty 2

## 2014-09-05 NOTE — Discharge Instructions (Signed)
Myelogram and Lumbar Puncture Discharge Instructions ° °1. Go home and rest quietly for the next 24 hours.  It is important to lie flat for the next 24 hours.  Get up only to go to the restroom.  You may lie in the bed or on a couch on your back, your stomach, your left side or your right side.  You may have one pillow under your head.  You may have pillows between your knees while you are on your side or under your knees while you are on your back. ° °2. DO NOT drive today.  Recline the seat as far back as it will go, while still wearing your seat belt, on the way home. ° °3. You may get up to go to the bathroom as needed.  You may sit up for 10 minutes to eat.  You may resume your normal diet and medications unless otherwise indicated. ° °4. The incidence of headache, nausea, or vomiting is about 5% (one in 20 patients).  If you develop a headache, lie flat and drink plenty of fluids until the headache goes away.  Caffeinated beverages may be helpful.  If you develop severe nausea and vomiting or a headache that does not go away with flat bed rest, call your doctor. ° °5. You may resume normal activities after your 24 hours of bed rest is over; however, do not exert yourself strongly or do any heavy lifting tomorrow. ° °6. Call your physician for a follow-up appointment.  The results of your myelogram will be sent directly to your physician by the following day. ° °7. If you have any questions or if complications develop after you arrive home, please call your doctor. ° °Discharge instructions have been explained to the patient.  The patient, or the person responsible for the patient, fully understands these instructions. ° ° °

## 2014-09-07 NOTE — Op Note (Signed)
09/05/2014 Lumbar, thoracic Myelogram  PATIENT:  Allen LimesDonald P Akopyan is a 49 y.o. male with lower extremity pain  PRE-OPERATIVE DIAGNOSIS:  Lumbago, thoracic spine pain  POST-OPERATIVE DIAGNOSIS:  same  PROCEDURE:  Lumbar Myelogram  SURGEON:  Brae Schaafsma  ANESTHESIA:   local LOCAL MEDICATIONS USED:  LIDOCAINE  Procedure Note: Allen LimesDonald P Fleming is a 49 y.o. male Was taken to the fluoroscopy suite and  positioned prone on the fluoroscopy table. His back was prepared and draped in a sterile manner. I infiltrated 15 cc into the lumbar region. I then introduced a spinal needle into the thecal sac at the L3/4 interlaminar space. I infiltrated 10cc of Omnipaque 300 into the thecal sac. Fluoroscopy showed the needle and contrast in the thecal sac. Roylene Reasononald P Frohlich tolerated the procedure well. he Will be taken to CT for evaluation.     PATIENT DISPOSITION:  Short Stay

## 2017-02-04 ENCOUNTER — Emergency Department (HOSPITAL_COMMUNITY): Payer: Medicare Other

## 2017-02-04 ENCOUNTER — Emergency Department (HOSPITAL_COMMUNITY)
Admission: EM | Admit: 2017-02-04 | Discharge: 2017-02-04 | Disposition: A | Discharge status: Home / Self Care | Payer: Medicare Other | Attending: Emergency Medicine | Admitting: Emergency Medicine

## 2017-02-04 ENCOUNTER — Encounter (HOSPITAL_COMMUNITY): Payer: Self-pay | Admitting: Emergency Medicine

## 2017-02-04 DIAGNOSIS — R1031 Right lower quadrant pain: Secondary | ICD-10-CM | POA: Diagnosis present

## 2017-02-04 DIAGNOSIS — Z7984 Long term (current) use of oral hypoglycemic drugs: Secondary | ICD-10-CM | POA: Insufficient documentation

## 2017-02-04 DIAGNOSIS — E7800 Pure hypercholesterolemia, unspecified: Secondary | ICD-10-CM | POA: Insufficient documentation

## 2017-02-04 DIAGNOSIS — R011 Cardiac murmur, unspecified: Secondary | ICD-10-CM | POA: Insufficient documentation

## 2017-02-04 DIAGNOSIS — E119 Type 2 diabetes mellitus without complications: Secondary | ICD-10-CM | POA: Insufficient documentation

## 2017-02-04 DIAGNOSIS — Z79899 Other long term (current) drug therapy: Secondary | ICD-10-CM | POA: Diagnosis not present

## 2017-02-04 LAB — URINALYSIS, ROUTINE W REFLEX MICROSCOPIC
Bilirubin Urine: NEGATIVE
GLUCOSE, UA: NEGATIVE mg/dL
HGB URINE DIPSTICK: NEGATIVE
Ketones, ur: NEGATIVE mg/dL
Leukocytes, UA: NEGATIVE
Nitrite: NEGATIVE
Protein, ur: NEGATIVE mg/dL
SPECIFIC GRAVITY, URINE: 1.003 — AB (ref 1.005–1.030)
pH: 6 (ref 5.0–8.0)

## 2017-02-04 LAB — COMPREHENSIVE METABOLIC PANEL
ALK PHOS: 64 U/L (ref 38–126)
ALT: 41 U/L (ref 17–63)
AST: 27 U/L (ref 15–41)
Albumin: 4.4 g/dL (ref 3.5–5.0)
Anion gap: 10 (ref 5–15)
BILIRUBIN TOTAL: 0.6 mg/dL (ref 0.3–1.2)
BUN: 10 mg/dL (ref 6–20)
CO2: 26 mmol/L (ref 22–32)
Calcium: 9.6 mg/dL (ref 8.9–10.3)
Chloride: 100 mmol/L — ABNORMAL LOW (ref 101–111)
Creatinine, Ser: 1.08 mg/dL (ref 0.61–1.24)
GFR calc non Af Amer: >60 mL/min (ref >60)
Glucose, Bld: 233 mg/dL — ABNORMAL HIGH (ref 65–99)
Potassium: 4.1 mmol/L (ref 3.5–5.1)
Sodium: 136 mmol/L (ref 135–145)
Total Protein: 7.3 g/dL (ref 6.5–8.1)

## 2017-02-04 LAB — CBC
HEMATOCRIT: 40.7 % (ref 39.0–52.0)
HEMOGLOBIN: 13.9 g/dL (ref 13.0–17.0)
MCH: 31.1 pg (ref 26.0–34.0)
MCHC: 34.2 g/dL (ref 30.0–36.0)
MCV: 91.1 fL (ref 78.0–100.0)
Platelets: 232 10*3/uL (ref 150–400)
RBC: 4.47 MIL/uL (ref 4.22–5.81)
RDW: 12.4 % (ref 11.5–15.5)
WBC: 7.4 10*3/uL (ref 4.0–10.5)

## 2017-02-04 LAB — LIPASE, BLOOD: Lipase: 38 U/L (ref 11–51)

## 2017-02-04 MED ORDER — IOPAMIDOL (ISOVUE-300) INJECTION 61%
INTRAVENOUS | Status: AC
  Administered 2017-02-04: 100 mL
  Filled 2017-02-04: qty 100

## 2017-02-04 NOTE — ED Notes (Signed)
Patient transported to CT 

## 2017-02-04 NOTE — ED Provider Notes (Signed)
[facility]-EMERGENCY DEPT Provider Note   CSN: [id number] Arrival date & time: 02/04/17  1519  By signing my name below, I, Allen Fleming, attest that this documentation has been prepared under the direction and in the presence of [employer], PA-C. Electronically Signed: Linna Fleming, Scribe. 02/04/2017. 7:40 PM.  History   Chief Complaint Chief Complaint  Patient presents with  . Abdominal Pain   The history is provided by the patient. No language interpreter was used.   HPI Comments: Allen Fleming is a 51 y.o. male with PMHx including DM and thoracic stenosis who presents to the Emergency Department complaining of persistent RLQ pain for four days. Patient has a h/o thoracic stenosis and states his abdominal pain is radicular pain from his thoracic spine. Prior to the last few days his thoracic pain has never radiated into his abdomen. He has been exercising regularly for the last two months but denies any heavy lifting. He reports some frequent constipation over the last couple of weeks. No alleviating factors noted. Patient is not currently employed. He denies scrotal swelling, nausea, vomiting, diarrhea, fevers, chills, rashes, or any other associated symptoms.  Past Medical History:  Diagnosis Date  . Diabetes mellitus   . Headache(784.0)   . Heart murmur   . Herniated disc   . High cholesterol   . Myelopathy of thoracic region   . Shortness of breath   . Sleep apnea    CPAP SINCE 98  . Thoracic stenosis     Patient Active Problem List   Diagnosis Date Noted  . HNP (herniated nucleus pulposus with myelopathy), thoracic 01/20/2012    Class: Chronic  . Intervertebral thoracic disc disorder with myelopathy, thoracic region 11/18/2011    Past Surgical History:  Procedure Laterality Date  . ANKLE FRACTURE SURGERY     RT  . BACK SURGERY    . THORACIC DISCECTOMY  02/21/2010   Dr Franky Macho       Home Medications    Prior to Admission medications   Medication Sig Start  Date End Date Taking? Authorizing Provider  cyclobenzaprine (FLEXERIL) 10 MG tablet Take 10 mg by mouth 3 (three) times daily as needed for muscle spasms.    [provider]  metFORMIN (GLUCOPHAGE) 500 MG tablet Take 500 mg by mouth 2 (two) times daily with a meal.    [provider]  oxyCODONE (OXYCONTIN) 40 MG 12 hr tablet Take 40 mg by mouth every 12 (twelve) hours.    [provider]  oxycodone (ROXICODONE) 30 MG immediate release tablet Take 30 mg by mouth every 4 (four) hours as needed for pain.    [provider]  pravastatin (PRAVACHOL) 40 MG tablet Take 40 mg by mouth daily.    [provider]    Family History No family history on file.  Social History Social History  Substance Use Topics  . Smoking status: Never Smoker  . Smokeless tobacco: Never Used  . Alcohol use 2.4 oz/week    4 Cans of beer per week     Comment: 5 drinks per week     Allergies   Morphine and related   Review of Systems Review of Systems  Constitutional: Negative for chills and fever.  Gastrointestinal: Positive for abdominal pain and constipation. Negative for diarrhea, nausea and vomiting.  Genitourinary: Negative for scrotal swelling.  Musculoskeletal: Positive for back pain.  Skin: Negative for rash.  All other systems reviewed and are negative.  Physical Exam Updated Vital Signs BP Marland Kitchen)  171/82 (BP Location: Left Arm)   Pulse 74   Temp 98.3 F (36.8 C) (Oral)   Resp 18   Ht 6' (1.829 m)   Wt 127 kg (280 lb)   SpO2 100%   BMI 37.97 kg/m   Physical Exam  Constitutional: He is oriented to person, place, and time. He appears well-developed and well-nourished. No distress.  HENT:  Head: Normocephalic and atraumatic.  Eyes: Conjunctivae and EOM are normal.  Neck: Neck supple. No tracheal deviation present.  Cardiovascular: Normal rate.   Pulmonary/Chest: Effort normal. No respiratory distress.  Abdominal: Soft. Bowel sounds are normal. He  exhibits no distension and no mass. There is tenderness. There is no rebound and no guarding. No hernia.  TTP RLQ  Musculoskeletal: Normal range of motion.  Neurological: He is alert and oriented to person, place, and time.  Skin: Skin is warm and dry.  Psychiatric: He has a normal mood and affect. His behavior is normal.  Nursing note and vitals reviewed.  ED Treatments / Results  Labs (all labs ordered are listed, but only abnormal results are displayed) Labs Reviewed  COMPREHENSIVE METABOLIC PANEL - Abnormal; Notable for the following:       Result Value   Chloride 100 (*)    Glucose, Bld 233 (*)    All other components within normal limits  LIPASE, BLOOD  CBC  URINALYSIS, ROUTINE W REFLEX MICROSCOPIC    EKG  EKG Interpretation None       Radiology No results found.  Procedures Procedures (including critical care time)  DIAGNOSTIC STUDIES: Oxygen Saturation is 100% on RA, normal by my interpretation.    COORDINATION OF CARE: 7:28 PM Discussed treatment plan with pt at bedside and pt agreed to plan.  Medications Ordered in ED Medications  iopamidol (ISOVUE-300) 61 % injection (not administered)     Initial Impression / Assessment and Plan / ED Course  I have reviewed the triage vital signs and the nursing notes.  Pertinent labs & imaging results that were available during my care of the patient were reviewed by me and considered in my medical decision making (see chart for details).      Final Clinical Impressions(s) / ED Diagnoses   Final diagnoses:  RLQ abdominal pain    Labs: CBC, CMP, lipase, urinalysis  Imaging: CT abdomen pelvis with contrast  Consults:  Therapeutics:  Discharge Meds:   Assessment/Plan: 51 year old male presents today with right lower quadrant abdominal pain.  Patient has reassuring workup thus far.  He is afebrile nontoxic with reassuring laboratory analysis.  Patient does have exquisite tenderness to palpation of the  right lower quadrant.  He has no testicular pain, bulging.  Concern for GI pathology, CT scan ordered.  Patient care will be signed to oncoming provider pending CT scan.   New Prescriptions New Prescriptions   No medications on file   I personally performed the services described in this documentation, which was scribed in my presence. The recorded information has been reviewed and is accurate.   Eyvonne MechanicHedges, Rhiannon Sassaman, PA-C 02/04/17 2117    Alvira MondaySchlossman, Erin, MD 02/07/17 [phone]

## 2017-02-04 NOTE — ED Notes (Signed)
This RN, the EMT, and another RN each attempted to start PIV on the patient.  Veins either blew or disappeared.  IV team consult placed.

## 2017-02-04 NOTE — ED Provider Notes (Signed)
This is a 51 year old male with past medical history significant for diabetes presenting to the emergency department for right lower quadrant pain 4 days. There is no nausea, vomiting, diarrhea, fever associated with this.  [facility] and urine pending. Plan if CT negative patient can return home with close follow-up with PCP.  CMP without electrolyte abnormalities. Normal kidney function. No leukocytosis or anemia on CBC. Lipase negative for pancreatitis. UA without evidence of UTI.  CT results showed no acute abnormality or explanation for right-sided pain. There is some hepatic steatosis and constipation noted. Discussed with patient. Will discharge the patient home.  I advised the patient to follow-up with their primary care provider this week. I advised the patient to return to the emergency department with new or worsening symptoms or new concerns. Specific return precautions discussed. The patient verbalized understanding and agreement with plan. All questions answered. No further questions at this time. Patient is stable at the time of discharge.    Jacinto HalimMaczis, Michele Judy M, PA-C 02/04/17 Criss [id number]    Alvira MondaySchlossman, Erin, MD 02/07/17 2140

## 2017-02-04 NOTE — ED Notes (Signed)
Pt asking for an Augmentin prescription as he thinks he is getting a sinus infection; sts he gets one every year.

## 2017-02-04 NOTE — Discharge Instructions (Signed)
Your CT was negative for appendicitis. Your urine did not show any evidence of infection. Your blood work did not show any evidence of kidney damage, electrolyte abnormalities, anemia, or infection. Please call your primary care provider this week for follow-up or persistent symptoms. If you develop worsening or new concerning symptoms you can return to the emergency department for re-evaluation.   Abdominal Pain  Your exam might not show the exact reason you have abdominal pain. Since there are many different causes of abdominal pain, another checkup and more tests may be needed. It is very important to follow up for lasting (persistent) or worsening symptoms. A possible cause of abdominal pain in any person who still has his or her appendix is acute appendicitis. Appendicitis is often hard to diagnose. Normal blood tests, urine tests, ultrasound, and CT scans do not completely rule out early appendicitis or other causes of abdominal pain. Sometimes, only the changes that happen over time will allow appendicitis and other causes of abdominal pain to be determined. Other potential problems that may require surgery may also take time to become more apparent. Because of this, it is important that you follow all of the instructions below.   HOME CARE INSTRUCTIONS  Do not take laxatives unless directed by your caregiver. Rest as much as possible.  Do not eat solid food until your pain is gone: A diet of water, weak decaffeinated tea, broth or bouillon, gelatin, oral rehydration solutions (ORS), frozen ice pops, or ice chips may be helpful.  When pain is gone: Start a light diet (dry toast, crackers, applesauce, or white rice). Increase the diet slowly as long as it does not bother you. Eat no dairy products (including cheese and eggs) and no spicy, fatty, fried, or high-fiber foods.  Use no alcohol, caffeine, or cigarettes.  Take your regular medicines unless your caregiver told you not to.  Take any  prescribed medicine as directed.   SEEK IMMEDIATE MEDICAL CARE IF:  The pain does not go away.  You have a fever >101 that does not go down with medication. You keep throwing up (vomiting) or cannot drink liquids.  You have shaking chills (rigors) The pain becomes localized (Pain in the right side could possibly be appendicitis. In an adult, pain in the left lower portion of the abdomen could be colitis or diverticulitis). You pass bloody or black tarry stools.  There is bright red blood in the stool.  There is blood in your vomit. Your bowel movements stop (become blocked) or you cannot pass gas.  The constipation stays for more than 4 days.  You have bloody, frequent, or painful urination.  You have yellow discoloration in the skin or whites of the eyes.  Your stomach becomes bloated or bigger.  You have dizziness or fainting.  You have chest or back pain. You have rectal pain.  You do not seem to be getting better.  You have any questions or concerns.

## 2017-02-04 NOTE — ED Triage Notes (Signed)
Pt arrives to ED with RLQ pain since Monday that has been more frequent. Pt denies any n/v/d.

## 2017-04-06 DIAGNOSIS — R010 Benign and innocent cardiac murmurs: Secondary | ICD-10-CM | POA: Diagnosis not present
